# Patient Record
Sex: Female | Born: 1950 | Race: White | Hispanic: No | Marital: Married | Smoking: Former smoker
Health system: Southern US, Community
[De-identification: ages and names within clinical notes are randomized; demographics above are authoritative.]

## PROBLEM LIST (undated history)

## (undated) DIAGNOSIS — I10 Essential (primary) hypertension: Secondary | ICD-10-CM

## (undated) DIAGNOSIS — E079 Disorder of thyroid, unspecified: Secondary | ICD-10-CM

## (undated) DIAGNOSIS — N812 Incomplete uterovaginal prolapse: Secondary | ICD-10-CM

## (undated) DIAGNOSIS — E785 Hyperlipidemia, unspecified: Secondary | ICD-10-CM

## (undated) DIAGNOSIS — N8110 Cystocele, unspecified: Secondary | ICD-10-CM

## (undated) DIAGNOSIS — E782 Mixed hyperlipidemia: Secondary | ICD-10-CM

## (undated) DIAGNOSIS — N819 Female genital prolapse, unspecified: Secondary | ICD-10-CM

## (undated) DIAGNOSIS — Z973 Presence of spectacles and contact lenses: Secondary | ICD-10-CM

## (undated) DIAGNOSIS — N393 Stress incontinence (female) (male): Secondary | ICD-10-CM

## (undated) DIAGNOSIS — E039 Hypothyroidism, unspecified: Secondary | ICD-10-CM

## (undated) DIAGNOSIS — J45909 Unspecified asthma, uncomplicated: Secondary | ICD-10-CM

## (undated) HISTORY — DX: Disorder of thyroid, unspecified: E07.9

## (undated) HISTORY — PX: WISDOM TOOTH EXTRACTION: SHX21

## (undated) HISTORY — PX: TUBAL LIGATION: SHX77

## (undated) HISTORY — DX: Hyperlipidemia, unspecified: E78.5

---

## 1975-09-15 HISTORY — PX: TUBAL LIGATION: SHX77

## 1997-10-15 ENCOUNTER — Ambulatory Visit (HOSPITAL_COMMUNITY): Admission: RE | Admit: 1997-10-15 | Discharge: 1997-10-15 | Payer: Self-pay | Admitting: *Deleted

## 1998-07-18 ENCOUNTER — Other Ambulatory Visit: Admission: RE | Admit: 1998-07-18 | Discharge: 1998-07-18 | Payer: Self-pay | Admitting: *Deleted

## 1998-11-25 ENCOUNTER — Ambulatory Visit (HOSPITAL_COMMUNITY): Admission: RE | Admit: 1998-11-25 | Discharge: 1998-11-25 | Payer: Self-pay | Admitting: *Deleted

## 2000-08-02 ENCOUNTER — Encounter: Payer: Self-pay | Admitting: Family Medicine

## 2000-08-02 ENCOUNTER — Ambulatory Visit (HOSPITAL_COMMUNITY): Admission: RE | Admit: 2000-08-02 | Discharge: 2000-08-02 | Payer: Self-pay | Admitting: Family Medicine

## 2001-10-26 ENCOUNTER — Ambulatory Visit (HOSPITAL_COMMUNITY): Admission: RE | Admit: 2001-10-26 | Discharge: 2001-10-26 | Payer: Self-pay | Admitting: Family Medicine

## 2001-10-26 ENCOUNTER — Encounter: Payer: Self-pay | Admitting: Family Medicine

## 2001-11-16 ENCOUNTER — Other Ambulatory Visit: Admission: RE | Admit: 2001-11-16 | Discharge: 2001-11-16 | Payer: Self-pay | Admitting: Obstetrics and Gynecology

## 2003-12-12 ENCOUNTER — Ambulatory Visit (HOSPITAL_COMMUNITY): Admission: RE | Admit: 2003-12-12 | Discharge: 2003-12-12 | Payer: Self-pay | Admitting: Obstetrics and Gynecology

## 2004-01-09 ENCOUNTER — Other Ambulatory Visit: Admission: RE | Admit: 2004-01-09 | Discharge: 2004-01-09 | Payer: Self-pay | Admitting: Obstetrics and Gynecology

## 2004-12-29 ENCOUNTER — Ambulatory Visit (HOSPITAL_COMMUNITY): Admission: RE | Admit: 2004-12-29 | Discharge: 2004-12-29 | Payer: Self-pay | Admitting: Obstetrics and Gynecology

## 2005-04-13 ENCOUNTER — Other Ambulatory Visit: Admission: RE | Admit: 2005-04-13 | Discharge: 2005-04-13 | Payer: Self-pay | Admitting: Obstetrics and Gynecology

## 2006-10-12 ENCOUNTER — Ambulatory Visit (HOSPITAL_COMMUNITY): Admission: RE | Admit: 2006-10-12 | Discharge: 2006-10-12 | Payer: Self-pay | Admitting: Physical Therapy

## 2007-10-25 ENCOUNTER — Ambulatory Visit (HOSPITAL_COMMUNITY): Admission: RE | Admit: 2007-10-25 | Discharge: 2007-10-25 | Payer: Self-pay | Admitting: Obstetrics and Gynecology

## 2008-09-14 HISTORY — PX: WISDOM TOOTH EXTRACTION: SHX21

## 2009-10-23 ENCOUNTER — Ambulatory Visit (HOSPITAL_COMMUNITY): Admission: RE | Admit: 2009-10-23 | Discharge: 2009-10-23 | Payer: Self-pay | Admitting: Obstetrics and Gynecology

## 2010-10-01 ENCOUNTER — Encounter
Admission: RE | Admit: 2010-10-01 | Discharge: 2010-10-01 | Payer: Self-pay | Source: Home / Self Care | Attending: Obstetrics and Gynecology | Admitting: Obstetrics and Gynecology

## 2012-01-20 ENCOUNTER — Other Ambulatory Visit (HOSPITAL_COMMUNITY): Payer: Self-pay | Admitting: Obstetrics and Gynecology

## 2012-01-20 DIAGNOSIS — Z1231 Encounter for screening mammogram for malignant neoplasm of breast: Secondary | ICD-10-CM

## 2012-01-28 ENCOUNTER — Other Ambulatory Visit: Payer: Self-pay | Admitting: Obstetrics and Gynecology

## 2012-01-28 DIAGNOSIS — N644 Mastodynia: Secondary | ICD-10-CM

## 2012-02-05 ENCOUNTER — Other Ambulatory Visit: Payer: Self-pay | Admitting: Obstetrics and Gynecology

## 2012-02-05 ENCOUNTER — Ambulatory Visit
Admission: RE | Admit: 2012-02-05 | Discharge: 2012-02-05 | Disposition: A | Discharge status: Home / Self Care | Payer: 59 | Source: Ambulatory Visit | Attending: Obstetrics and Gynecology | Admitting: Obstetrics and Gynecology

## 2012-02-05 DIAGNOSIS — N644 Mastodynia: Secondary | ICD-10-CM

## 2012-02-16 ENCOUNTER — Ambulatory Visit (HOSPITAL_COMMUNITY): Payer: 59

## 2012-02-27 IMAGING — MG MM DIGITAL DIAGNOSTIC BILAT
4 series · 4 of 4 positions shown · non-contrast
Comparison: 10/23/2009, 10/25/2007, 10/12/2006

CLINICAL DATA: Several weeks ago, the patient had tenderness and
erythema of the skin in the left upper inner quadrant.  This
resolved several days later.

DIGITAL DIAGNOSTIC BILATERAL MAMMOGRAM WITH CAD

[R CC]
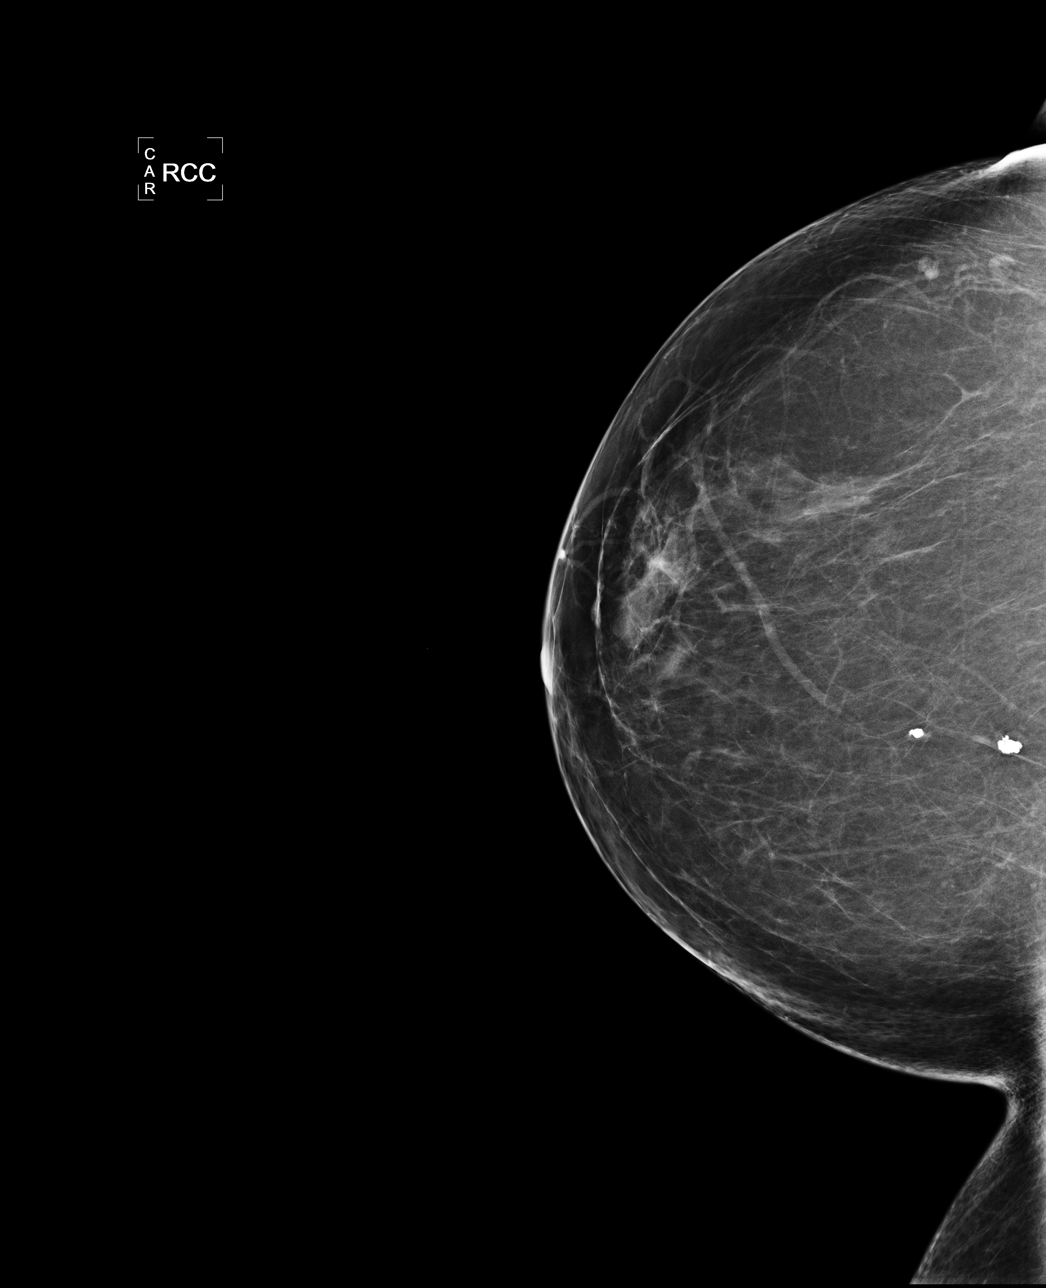

[L CC]
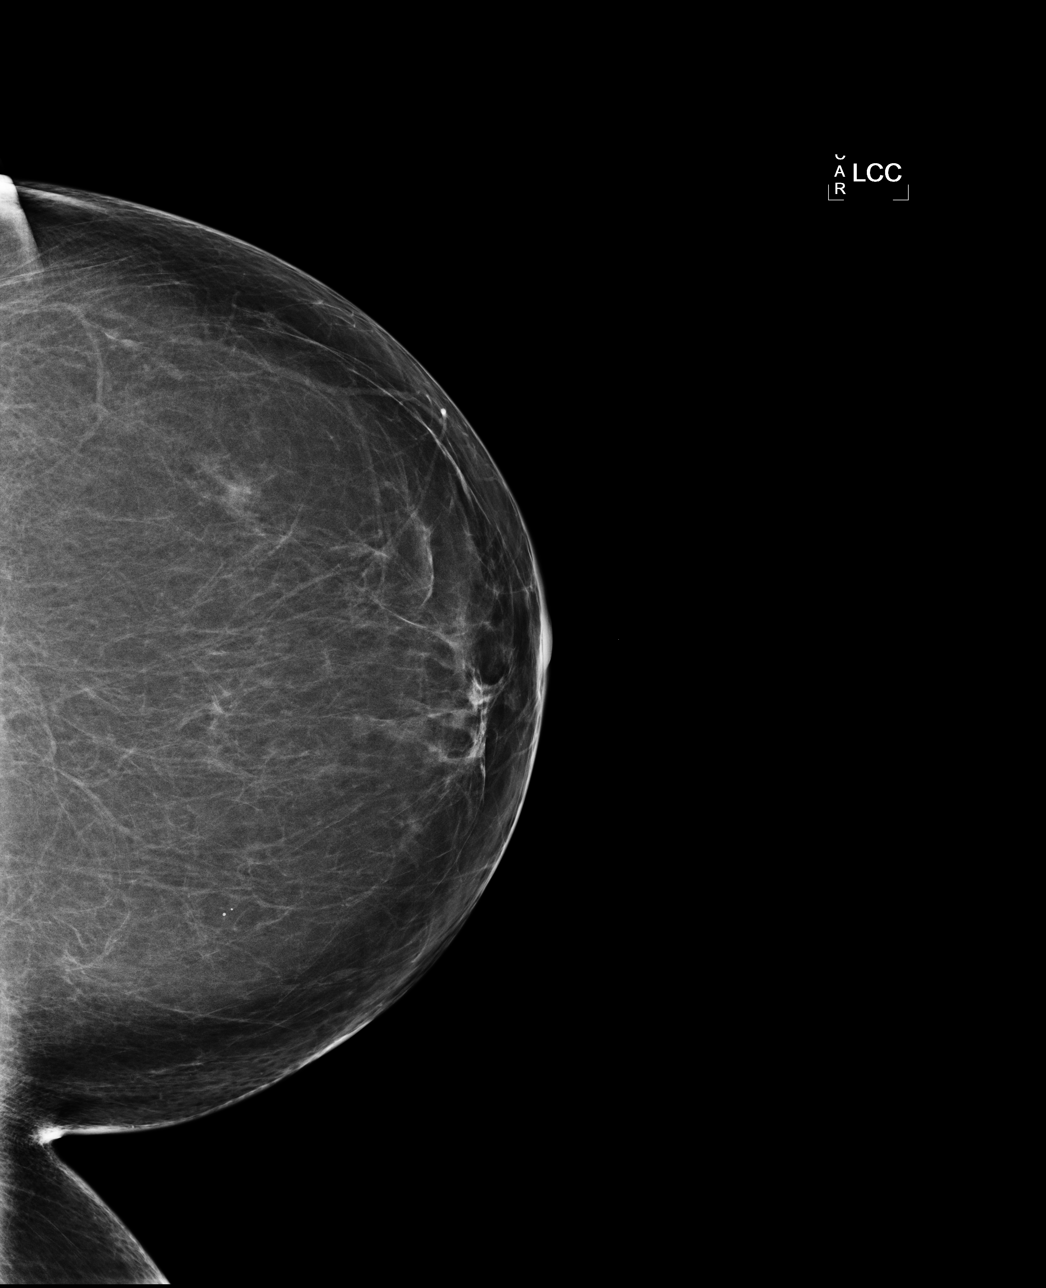

[L MLO]
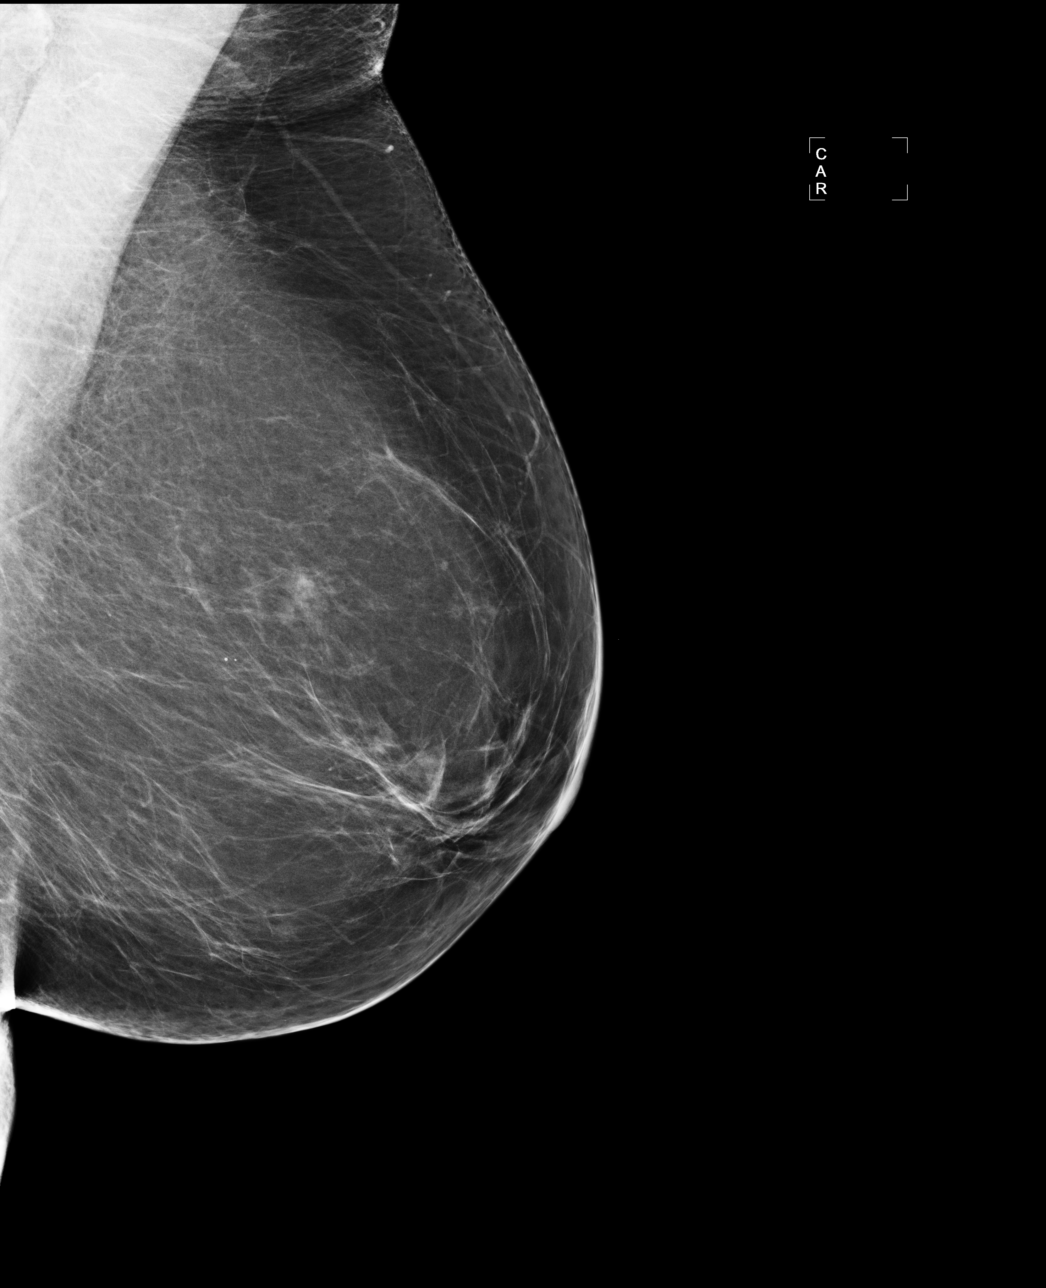

[R MLO]
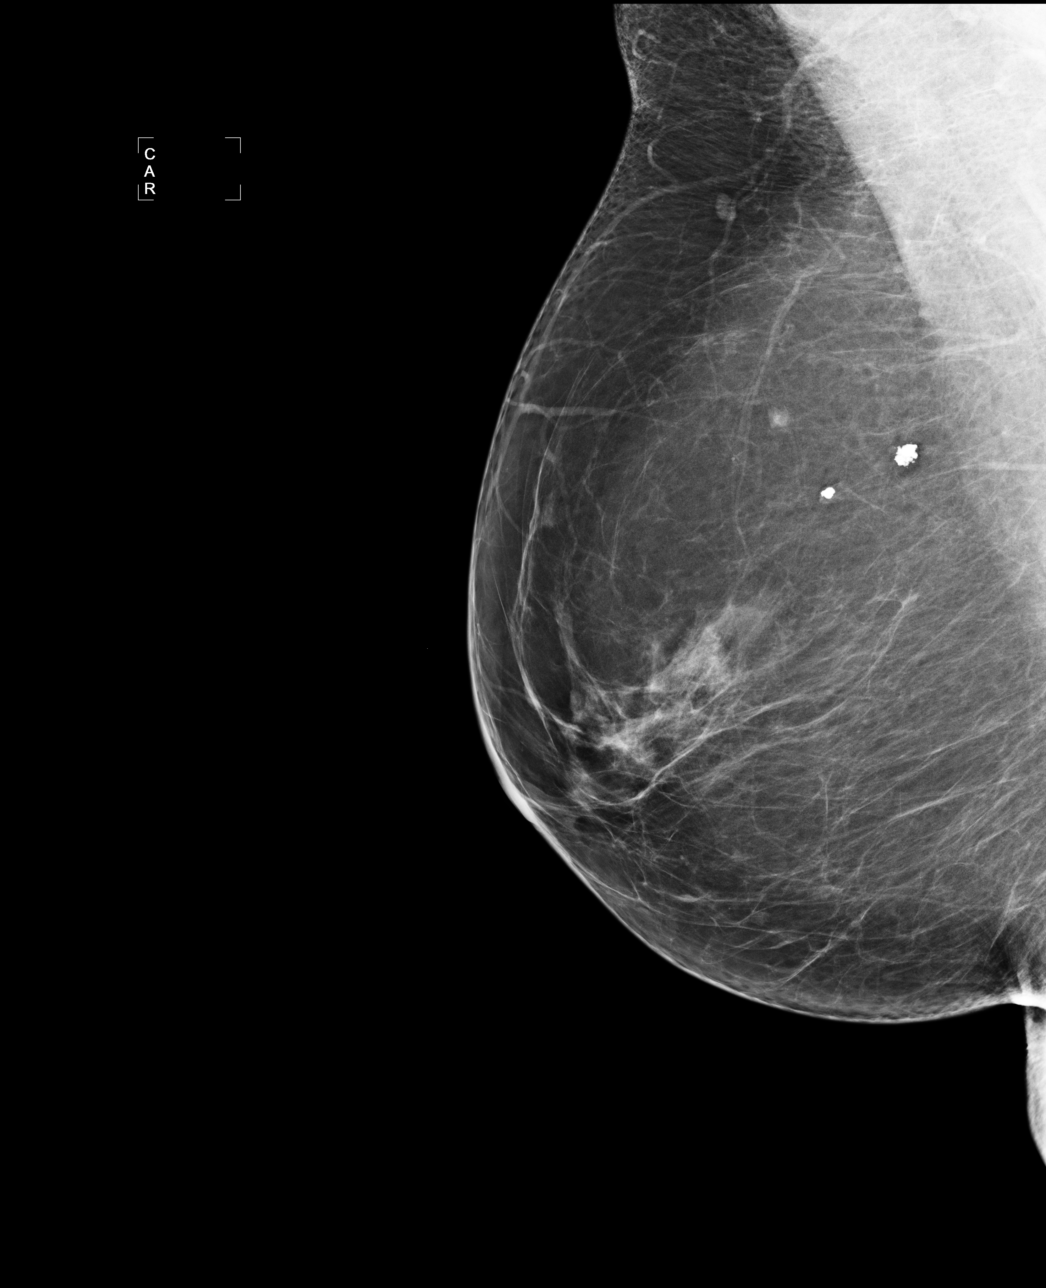

[4 of 4 positions shown; findings below may reference images not displayed]

FINDINGS: The breast tissue is almost entirely fatty.  There is no
dominant mass, architectural distortion or calcification to
malignancy.  On physical examination, no erythema is noted in the
left breast today.
Mammographic images were processed with CAD.
IMPRESSION: No mammographic evidence of malignancy.  Yearly screening
mammography is suggested.

BI-RADS CATEGORY 1:  Negative.

## 2012-08-09 ENCOUNTER — Ambulatory Visit (INDEPENDENT_AMBULATORY_CARE_PROVIDER_SITE_OTHER): Payer: 59 | Admitting: Cardiovascular Disease

## 2012-08-09 ENCOUNTER — Encounter: Payer: Self-pay | Admitting: Cardiovascular Disease

## 2012-08-09 VITALS — BP 128/80 | HR 73 | Ht 63.0 in | Wt 138.8 lb

## 2012-08-09 DIAGNOSIS — E785 Hyperlipidemia, unspecified: Secondary | ICD-10-CM

## 2012-08-09 DIAGNOSIS — R0609 Other forms of dyspnea: Secondary | ICD-10-CM | POA: Insufficient documentation

## 2012-08-09 DIAGNOSIS — R9431 Abnormal electrocardiogram [ECG] [EKG]: Secondary | ICD-10-CM

## 2012-08-09 NOTE — Progress Notes (Signed)
    Ebony Hatfield Date of Birth  1951-05-02       [facility] Office [address]  [address] [place], [place]  [address]   [place], [place]  [address] [phone]     [phone]   Fax  [phone]    Fax [phone]  Problem List: 1. Dyspnea with exertion 2. Hyperlipidemia 3. Hypothyroidism  History of Present Illness:  Ebony Hatfield is a 61 yo who presents for further evaluation of dyspnea with exertion.  She's walked the same route around her neighborhood for the past several years. She typically walks one to one and 1/2 miles a day. Recently, she's noticed severe shortness breath with any sort of exertion.  She is still able to complete the 1-1/2 mile route around her house but she has significant shortness of breath. The shortness of breath dyspnea. She denies any syncope. She denies chest pain.  Current Outpatient Prescriptions on File Prior to Visit  Medication Sig Dispense Refill  . colesevelam (WELCHOL) 625 MG tablet Take 1,875 mg by mouth 2 (two) times daily with a meal.      . fexofenadine (ALLEGRA) 180 MG tablet Take 180 mg by mouth daily.      . Pitavastatin Calcium (LIVALO PO) Take 4 mg by mouth daily.      Marland Kitchen SYNTHROID 100 MCG tablet Take 100 mcg by mouth daily.         No Known Allergies  Past Medical History  Diagnosis Date  . Thyroid disease   . Hyperlipidemia     Past Surgical History  Procedure Date  . Tubal ligation   . Wisdom tooth extraction     History  Smoking status  . Former Smoker  Smokeless tobacco  . Not on file    Comment: Quit 35 yrs ago    History  Alcohol Use  . Yes    Comment: 1 Glass Daily    History reviewed. No pertinent family history.  Reviw of Systems:  Reviewed in the HPI.  All other systems are negative.  Physical Exam: Blood pressure 128/80, pulse 73, height 5\' 3"  (1.6 m), weight 138 lb 12.8 oz (62.959 kg). General: Well developed, well nourished, in no acute  distress.  Head: Normocephalic, atraumatic, sclera non-icteric, mucus membranes are moist,   Neck: Supple. Carotids are 2 + without bruits. No JVD   Lungs: Clear   Heart: RR, normal S1, S2. No murmurs  Abdomen: Soft, non-tender, non-distended with normal bowel sounds.  Msk:  Strength and tone are normal   Extremities: No clubbing or cyanosis. No edema.  Distal pedal pulses are 2+ and equal    Neuro: CN II - XII intact.  Alert and oriented X 3.   Psych:  Normal   ECG: Nov. 26, 2013 - NSR at 7. Normal ECG.  Assessment / Plan:

## 2012-08-09 NOTE — Patient Instructions (Addendum)
Your physician has requested that you have an echocardiogram. Echocardiography is a painless test that uses sound waves to create images of your heart. It provides your doctor with information about the size and shape of your heart and how well your heart's chambers and valves are working. This procedure takes approximately one hour. There are no restrictions for this procedure.  Your physician has requested that you have en exercise stress myoview.  Please follow instruction sheet, as given.  Your physician recommends that you schedule a follow-up appointment in: 1 MONTH WITH DR Elease Hashimoto

## 2012-08-09 NOTE — Assessment & Plan Note (Signed)
Ebony Hatfield presents for further evaluation of her dyspnea on exertion. She has  walked in her neighborhood   about 1-1/2 miles a day for the past several years. Recently, she's had shortness breath when climbing hills. This is a fairly new to her.  She has some slight EKG abnormalities. She does not have any history of hypertension.  Out like to proceed with a stress Myoview study for further evaluation and to rule out ischemia. Will also need to get an echocardiogram to rule out diastolic dysfunction. I've asked her to continue walking. I'll see her again in one month for followup evaluation.

## 2012-08-09 NOTE — Assessment & Plan Note (Signed)
Continue with her same medications. This is being monitored by Dr. Collins Scotland.

## 2012-08-16 ENCOUNTER — Ambulatory Visit (HOSPITAL_COMMUNITY): Payer: 59 | Attending: Cardiovascular Disease

## 2012-08-16 DIAGNOSIS — E785 Hyperlipidemia, unspecified: Secondary | ICD-10-CM | POA: Insufficient documentation

## 2012-08-16 DIAGNOSIS — R0609 Other forms of dyspnea: Secondary | ICD-10-CM | POA: Insufficient documentation

## 2012-08-16 DIAGNOSIS — R9431 Abnormal electrocardiogram [ECG] [EKG]: Secondary | ICD-10-CM | POA: Insufficient documentation

## 2012-08-16 DIAGNOSIS — R0602 Shortness of breath: Secondary | ICD-10-CM

## 2012-08-16 DIAGNOSIS — R0989 Other specified symptoms and signs involving the circulatory and respiratory systems: Secondary | ICD-10-CM | POA: Insufficient documentation

## 2012-08-16 DIAGNOSIS — Z87891 Personal history of nicotine dependence: Secondary | ICD-10-CM | POA: Insufficient documentation

## 2012-08-16 NOTE — Progress Notes (Signed)
Echocardiogram performed.  

## 2012-08-17 ENCOUNTER — Telehealth: Payer: Self-pay | Admitting: Cardiovascular Disease

## 2012-08-17 NOTE — Telephone Encounter (Signed)
Error

## 2012-08-18 ENCOUNTER — Ambulatory Visit (HOSPITAL_COMMUNITY): Payer: 59 | Attending: Cardiology | Admitting: Radiology

## 2012-08-18 VITALS — BP 141/95 | HR 67 | Ht 63.0 in | Wt 134.0 lb

## 2012-08-18 DIAGNOSIS — R0602 Shortness of breath: Secondary | ICD-10-CM

## 2012-08-18 DIAGNOSIS — R9431 Abnormal electrocardiogram [ECG] [EKG]: Secondary | ICD-10-CM

## 2012-08-18 DIAGNOSIS — R0989 Other specified symptoms and signs involving the circulatory and respiratory systems: Secondary | ICD-10-CM | POA: Insufficient documentation

## 2012-08-18 DIAGNOSIS — R0609 Other forms of dyspnea: Secondary | ICD-10-CM | POA: Insufficient documentation

## 2012-08-18 DIAGNOSIS — Z87891 Personal history of nicotine dependence: Secondary | ICD-10-CM | POA: Insufficient documentation

## 2012-08-18 DIAGNOSIS — E785 Hyperlipidemia, unspecified: Secondary | ICD-10-CM | POA: Insufficient documentation

## 2012-08-18 MED ORDER — TECHNETIUM TC 99M SESTAMIBI GENERIC - CARDIOLITE
33.0000 | Freq: Once | INTRAVENOUS | Status: AC | PRN
  Administered 2012-08-18: 33 via INTRAVENOUS

## 2012-08-18 MED ORDER — TECHNETIUM TC 99M SESTAMIBI GENERIC - CARDIOLITE
11.0000 | Freq: Once | INTRAVENOUS | Status: AC | PRN
  Administered 2012-08-18: 11 via INTRAVENOUS

## 2012-08-18 NOTE — Progress Notes (Deleted)
   Patient ID: Ebony Hatfield, female    DOB: [birth year], 61 y.o.   MRN: [id number]  HPI    Review of Systems    Physical Exam

## 2012-08-18 NOTE — Progress Notes (Signed)
[facility] SITE 3 NUCLEAR MED [id number] [place] [address] [phone]  Cardiology Nuclear Med Study  Ebony Hatfield is a 61 y.o. female     MRN : [id number]     DOB: [birth year]  Procedure Date: 08/18/2012  Nuclear Med Background Indication for Stress Test:  Evaluation for Ischemia and Abnormal EKG History:  08/16/12 Echo:EF=65% Cardiac Risk Factors: History of Smoking and Lipids  Symptoms:  DOE   Nuclear Pre-Procedure Caffeine/Decaff Intake:  None > 12 hrs NPO After: 8:00pm   Lungs:  Clear. O2 Sat: 96% on room air. IV 0.9% NS with Angio Cath:  22g  IV Site: R Antecubital x 1, tolerated well IV Started by:  Irean Hong, RN  Chest Size (in):  36 Cup Size: D  Height: 5\' 3"  (1.6 m)  Weight:  134 lb (60.782 kg)  BMI:  Body mass index is 23.74 kg/(m^2). Tech Comments:  n/a    Nuclear Med Study 1 or 2 day study: 1 day  Stress Test Type:  Stress  Reading MD: Olga Millers, MD  Order Authorizing Provider:  Kristeen Miss, MD  Resting Radionuclide: Technetium 40m Sestamibi  Resting Radionuclide Dose: 11.0 mCi   Stress Radionuclide:  Technetium 67m Sestamibi  Stress Radionuclide Dose: 33.0 mCi           Stress Protocol Rest HR: 67 Stress HR: 144  Rest BP: 141/95 Stress BP: 215/105  Exercise Time (min): 6:46 METS: 7.0   Predicted Max HR: 157 bpm % Max HR: 91.72 bpm Rate Pressure Product: 40347   Dose of Adenosine (mg):  n/a Dose of Lexiscan: n/a mg  Dose of Atropine (mg): n/a Dose of Dobutamine: n/a mcg/kg/min (at max HR)  Stress Test Technologist: Smiley Houseman, CMA-N  Nuclear Technologist:  Domenic Polite, CNMT     Rest Procedure:  Myocardial perfusion imaging was performed at rest 45 minutes following the intravenous administration of Technetium 35m Sestamibi.  Rest ECG: NSR - Normal EKG  Stress Procedure:  The patient exercised on the treadmill utilizing the Bruce protocol for 6:46 minutes. She then stopped due to dyspnea and  denied any chest pain.  Technetium 44m Sestamibi was injected at peak exercise and myocardial perfusion imaging was performed after a brief delay.  Stress ECG: Insignificant upsloping ST segment depression.  QPS Raw Data Images:  Acquisition technically good; normal left ventricular size. Stress Images:  Normal homogeneous uptake in all areas of the myocardium. Rest Images:  Normal homogeneous uptake in all areas of the myocardium. Subtraction (SDS):  No evidence of ischemia. Transient Ischemic Dilatation (Normal <1.22):  0.94 Lung/Heart Ratio (Normal <0.45):  0.36  Quantitative Gated Spect Images QGS EDV:  39 ml QGS ESV:  6 ml  Impression Exercise Capacity:  Fair exercise capacity. BP Response:  Hypertensive blood pressure response. Clinical Symptoms:  There is dyspnea. ECG Impression:  Insignificant upsloping ST segment depression. Comparison with Prior Nuclear Study: No previous nuclear study performed  Overall Impression:  Normal stress nuclear study.  LV Ejection Fraction: 86%.  LV Wall Motion:  NL LV Function; NL Wall Motion  Olga Millers

## 2012-09-19 ENCOUNTER — Encounter: Payer: Self-pay | Admitting: Cardiovascular Disease

## 2012-09-19 ENCOUNTER — Ambulatory Visit (INDEPENDENT_AMBULATORY_CARE_PROVIDER_SITE_OTHER): Payer: 59 | Admitting: Cardiovascular Disease

## 2012-09-19 VITALS — BP 143/85 | HR 72 | Ht 63.0 in | Wt 137.0 lb

## 2012-09-19 DIAGNOSIS — R0602 Shortness of breath: Secondary | ICD-10-CM

## 2012-09-19 NOTE — Assessment & Plan Note (Signed)
The workup for her dyspnea was essentially normal. She is a normal echocardiogram. She has no ischemia on echo.  At this time we will see her on an as-needed basis. We will have her followup with her general medical Dr.

## 2012-09-19 NOTE — Patient Instructions (Addendum)
Your physician recommends that you schedule a follow-up appointment in: as needed basis, make appointment with your primary care physician for follow up.

## 2012-09-19 NOTE — Progress Notes (Signed)
Ebony Hatfield Date of Birth  1951/07/24       [facility] Office [address]  [address] [place], [place]  [address]   [place], [place]  [address] [phone]     [phone]   Fax  [phone]    Fax [phone]  Problem List: 1. Dyspnea with exertion 2. Hyperlipidemia 3. Hypothyroidism  History of Present Illness:  Ebony Hatfield is a 62 yo who presents for further evaluation of dyspnea with exertion.  She's walked the same route around her neighborhood for the past several years. She typically walks one to one and 1/2 miles a day. Recently, she's noticed severe shortness breath with any sort of exertion.  She is still able to complete the 1-1/2 mile route around her house but she has significant shortness of breath. The shortness of breath dyspnea. She denies any syncope. She denies chest pain.  09/19/2012: She had a stress Myoview study which revealed no evidence of ischemia. She had an echocardiogram which revealed a normal left ventricular systolic function. She does have some mild diastolic dysfunction.  She has not had any significant episodes of dyspnea.  She had improved with Singular and other breathing meds.  She continues to have leg cramps whenever she takes statins.   Current Outpatient Prescriptions on File Prior to Visit  Medication Sig Dispense Refill  . colesevelam (WELCHOL) 625 MG tablet Take 1,875 mg by mouth 2 (two) times daily with a meal.      . DULERA 200-5 MCG/ACT AERO 2 puffs 2 (two) times daily.       . fexofenadine (ALLEGRA) 180 MG tablet Take 180 mg by mouth as needed.       . montelukast (SINGULAIR) 10 MG tablet Take 10 mg by mouth at bedtime.       Marland Kitchen PROAIR HFA 108 (90 BASE) MCG/ACT inhaler Inhale into the lungs every 4 (four) hours as needed.       Marland Kitchen SYNTHROID 100 MCG tablet Take 100 mcg by mouth daily.         Allergies  Allergen Reactions  . Crestor (Rosuvastatin)     Leg pain   . Lipitor  (Atorvastatin)     Muscle aches  . Livalo (Pitavastatin)     LEG CRAMPS  . Pravachol (Pravastatin)     Muscle aches    Past Medical History  Diagnosis Date  . Thyroid disease   . Hyperlipidemia     Past Surgical History  Procedure Date  . Tubal ligation   . Wisdom tooth extraction     History  Smoking status  . Former Smoker  Smokeless tobacco  . Not on file    Comment: Quit 35 yrs ago    History  Alcohol Use  . Yes    Comment: 1 Glass Daily    No family history on file.  Reviw of Systems:  Reviewed in the HPI.  All other systems are negative.  Physical Exam: Blood pressure 143/85, pulse 72, height 5\' 3"  (1.6 m), weight 137 lb (62.143 kg). General: Well developed, well nourished, in no acute distress.  Head: Normocephalic, atraumatic, sclera non-icteric, mucus membranes are moist,   Neck: Supple. Carotids are 2 + without bruits. No JVD   Lungs: Clear   Heart: RR, normal S1, S2. No murmurs  Abdomen: Soft, non-tender, non-distended with normal bowel sounds.  Msk:  Strength and tone are normal   Extremities: No clubbing or cyanosis.  No edema.  Distal pedal pulses are 2+ and equal    Neuro: CN II - XII intact.  Alert and oriented X 3.   Psych:  Normal   ECG: Nov. 26, 2013 - NSR at 7. Normal ECG.  Assessment / Plan:

## 2013-06-01 ENCOUNTER — Other Ambulatory Visit: Payer: Self-pay

## 2013-06-01 DIAGNOSIS — Z1231 Encounter for screening mammogram for malignant neoplasm of breast: Secondary | ICD-10-CM

## 2013-06-22 ENCOUNTER — Ambulatory Visit
Admission: RE | Admit: 2013-06-22 | Discharge: 2013-06-22 | Disposition: A | Discharge status: Home / Self Care | Payer: Medicare Other | Source: Ambulatory Visit

## 2013-06-22 DIAGNOSIS — Z1231 Encounter for screening mammogram for malignant neoplasm of breast: Secondary | ICD-10-CM

## 2013-06-27 ENCOUNTER — Other Ambulatory Visit: Payer: Self-pay | Admitting: Obstetrics and Gynecology

## 2013-06-27 DIAGNOSIS — R928 Other abnormal and inconclusive findings on diagnostic imaging of breast: Secondary | ICD-10-CM

## 2013-07-12 ENCOUNTER — Ambulatory Visit
Admission: RE | Admit: 2013-07-12 | Discharge: 2013-07-12 | Disposition: A | Discharge status: Home / Self Care | Payer: Medicare Other | Source: Ambulatory Visit | Attending: Obstetrics and Gynecology | Admitting: Obstetrics and Gynecology

## 2013-07-12 DIAGNOSIS — R928 Other abnormal and inconclusive findings on diagnostic imaging of breast: Secondary | ICD-10-CM

## 2014-01-22 ENCOUNTER — Other Ambulatory Visit: Payer: Self-pay | Admitting: Obstetrics and Gynecology

## 2014-01-22 DIAGNOSIS — R921 Mammographic calcification found on diagnostic imaging of breast: Secondary | ICD-10-CM

## 2014-02-01 ENCOUNTER — Ambulatory Visit
Admission: RE | Admit: 2014-02-01 | Discharge: 2014-02-01 | Disposition: A | Discharge status: Home / Self Care | Payer: Medicare Other | Source: Ambulatory Visit | Attending: Obstetrics and Gynecology | Admitting: Obstetrics and Gynecology

## 2014-02-01 DIAGNOSIS — R921 Mammographic calcification found on diagnostic imaging of breast: Secondary | ICD-10-CM

## 2014-06-25 ENCOUNTER — Other Ambulatory Visit: Payer: Self-pay | Admitting: Obstetrics and Gynecology

## 2014-06-25 DIAGNOSIS — R921 Mammographic calcification found on diagnostic imaging of breast: Secondary | ICD-10-CM

## 2014-07-02 ENCOUNTER — Encounter (INDEPENDENT_AMBULATORY_CARE_PROVIDER_SITE_OTHER): Payer: Self-pay

## 2014-07-02 ENCOUNTER — Ambulatory Visit
Admission: RE | Admit: 2014-07-02 | Discharge: 2014-07-02 | Disposition: A | Discharge status: Home / Self Care | Payer: Medicare Other | Source: Ambulatory Visit | Attending: Obstetrics and Gynecology | Admitting: Obstetrics and Gynecology

## 2014-07-02 DIAGNOSIS — R921 Mammographic calcification found on diagnostic imaging of breast: Secondary | ICD-10-CM

## 2015-09-20 DIAGNOSIS — J22 Unspecified acute lower respiratory infection: Secondary | ICD-10-CM | POA: Diagnosis not present

## 2015-11-08 ENCOUNTER — Other Ambulatory Visit: Payer: Self-pay | Admitting: Obstetrics and Gynecology

## 2015-11-08 DIAGNOSIS — R921 Mammographic calcification found on diagnostic imaging of breast: Secondary | ICD-10-CM

## 2015-11-14 ENCOUNTER — Ambulatory Visit
Admission: RE | Admit: 2015-11-14 | Discharge: 2015-11-14 | Disposition: A | Discharge status: Home / Self Care | Payer: Medicare Other | Source: Ambulatory Visit | Attending: Obstetrics and Gynecology | Admitting: Obstetrics and Gynecology

## 2015-11-14 DIAGNOSIS — R921 Mammographic calcification found on diagnostic imaging of breast: Secondary | ICD-10-CM | POA: Diagnosis not present

## 2015-12-18 DIAGNOSIS — Z1211 Encounter for screening for malignant neoplasm of colon: Secondary | ICD-10-CM | POA: Diagnosis not present

## 2015-12-18 DIAGNOSIS — K5730 Diverticulosis of large intestine without perforation or abscess without bleeding: Secondary | ICD-10-CM | POA: Diagnosis not present

## 2016-02-19 DIAGNOSIS — E209 Hypoparathyroidism, unspecified: Secondary | ICD-10-CM | POA: Diagnosis not present

## 2016-02-19 DIAGNOSIS — Z6824 Body mass index (BMI) 24.0-24.9, adult: Secondary | ICD-10-CM | POA: Diagnosis not present

## 2016-02-19 DIAGNOSIS — Z1329 Encounter for screening for other suspected endocrine disorder: Secondary | ICD-10-CM | POA: Diagnosis not present

## 2016-02-19 DIAGNOSIS — Z1321 Encounter for screening for nutritional disorder: Secondary | ICD-10-CM | POA: Diagnosis not present

## 2016-02-19 DIAGNOSIS — Z124 Encounter for screening for malignant neoplasm of cervix: Secondary | ICD-10-CM | POA: Diagnosis not present

## 2016-02-19 DIAGNOSIS — Z01419 Encounter for gynecological examination (general) (routine) without abnormal findings: Secondary | ICD-10-CM | POA: Diagnosis not present

## 2016-03-11 DIAGNOSIS — M8588 Other specified disorders of bone density and structure, other site: Secondary | ICD-10-CM | POA: Diagnosis not present

## 2016-03-11 DIAGNOSIS — N958 Other specified menopausal and perimenopausal disorders: Secondary | ICD-10-CM | POA: Diagnosis not present

## 2016-04-29 DIAGNOSIS — H43813 Vitreous degeneration, bilateral: Secondary | ICD-10-CM | POA: Diagnosis not present

## 2016-04-29 DIAGNOSIS — H524 Presbyopia: Secondary | ICD-10-CM | POA: Diagnosis not present

## 2016-04-29 DIAGNOSIS — H5213 Myopia, bilateral: Secondary | ICD-10-CM | POA: Diagnosis not present

## 2016-04-29 DIAGNOSIS — H52223 Regular astigmatism, bilateral: Secondary | ICD-10-CM | POA: Diagnosis not present

## 2016-04-29 DIAGNOSIS — H2513 Age-related nuclear cataract, bilateral: Secondary | ICD-10-CM | POA: Diagnosis not present

## 2016-06-12 DIAGNOSIS — Z23 Encounter for immunization: Secondary | ICD-10-CM | POA: Diagnosis not present

## 2016-11-05 DIAGNOSIS — R198 Other specified symptoms and signs involving the digestive system and abdomen: Secondary | ICD-10-CM | POA: Diagnosis not present

## 2016-11-05 DIAGNOSIS — K5730 Diverticulosis of large intestine without perforation or abscess without bleeding: Secondary | ICD-10-CM | POA: Diagnosis not present

## 2017-01-15 ENCOUNTER — Other Ambulatory Visit: Payer: Self-pay | Admitting: Obstetrics and Gynecology

## 2017-01-15 DIAGNOSIS — Z1231 Encounter for screening mammogram for malignant neoplasm of breast: Secondary | ICD-10-CM

## 2017-02-09 DIAGNOSIS — J069 Acute upper respiratory infection, unspecified: Secondary | ICD-10-CM | POA: Diagnosis not present

## 2017-02-09 DIAGNOSIS — J4520 Mild intermittent asthma, uncomplicated: Secondary | ICD-10-CM | POA: Diagnosis not present

## 2017-02-11 ENCOUNTER — Ambulatory Visit
Admission: RE | Admit: 2017-02-11 | Discharge: 2017-02-11 | Disposition: A | Discharge status: Home / Self Care | Payer: Medicare Other | Source: Ambulatory Visit | Attending: Obstetrics and Gynecology | Admitting: Obstetrics and Gynecology

## 2017-02-11 DIAGNOSIS — Z1231 Encounter for screening mammogram for malignant neoplasm of breast: Secondary | ICD-10-CM

## 2017-02-23 DIAGNOSIS — E785 Hyperlipidemia, unspecified: Secondary | ICD-10-CM | POA: Diagnosis not present

## 2017-02-23 DIAGNOSIS — E039 Hypothyroidism, unspecified: Secondary | ICD-10-CM | POA: Diagnosis not present

## 2017-02-23 DIAGNOSIS — I10 Essential (primary) hypertension: Secondary | ICD-10-CM | POA: Diagnosis not present

## 2017-03-03 DIAGNOSIS — E785 Hyperlipidemia, unspecified: Secondary | ICD-10-CM | POA: Diagnosis not present

## 2017-03-03 DIAGNOSIS — E039 Hypothyroidism, unspecified: Secondary | ICD-10-CM | POA: Diagnosis not present

## 2017-04-21 DIAGNOSIS — Z23 Encounter for immunization: Secondary | ICD-10-CM | POA: Diagnosis not present

## 2017-04-29 DIAGNOSIS — H2513 Age-related nuclear cataract, bilateral: Secondary | ICD-10-CM | POA: Diagnosis not present

## 2017-06-17 DIAGNOSIS — Z23 Encounter for immunization: Secondary | ICD-10-CM | POA: Diagnosis not present

## 2017-08-03 ENCOUNTER — Telehealth: Payer: Self-pay

## 2017-08-03 NOTE — Telephone Encounter (Signed)
NOTES ENT TO SCHEDULING 

## 2017-08-27 ENCOUNTER — Encounter: Payer: Self-pay | Admitting: *Deleted

## 2017-09-02 ENCOUNTER — Ambulatory Visit (INDEPENDENT_AMBULATORY_CARE_PROVIDER_SITE_OTHER): Payer: Medicare Other | Admitting: Cardiovascular Disease

## 2017-09-02 ENCOUNTER — Encounter (INDEPENDENT_AMBULATORY_CARE_PROVIDER_SITE_OTHER): Payer: Self-pay

## 2017-09-02 ENCOUNTER — Encounter: Payer: Self-pay | Admitting: Cardiovascular Disease

## 2017-09-02 VITALS — BP 112/66 | HR 73 | Ht 62.0 in | Wt 132.8 lb

## 2017-09-02 DIAGNOSIS — E785 Hyperlipidemia, unspecified: Secondary | ICD-10-CM | POA: Diagnosis not present

## 2017-09-02 NOTE — Patient Instructions (Addendum)
Medication Instructions:  Your physician recommends that you continue on your current medications as directed. Please refer to the Current Medication list given to you today.  Labwork: NONE  Testing/Procedures: NONE  Follow-Up: Your physician recommends that you schedule an appointment with our lipid clinic.  Your physician wants you to follow-up in: 12 months with Dr. Elease HashimotoNahser. You will receive a reminder letter in the mail two months in advance. If you don't receive a letter, please call our office to schedule the follow-up appointment.   If you need a refill on your cardiac medications before your next appointment, please call your pharmacy.

## 2017-09-02 NOTE — Progress Notes (Signed)
Cardiology Office Note:    Date:  09/02/2017   ID:  Ebony Hatfield, DOB Apr 17, 1951, MRN [id number]  PCP:  Ebony HasMorrow, Aaron, MD  Cardiologist:  Ebony MissPhilip Nahser, MD    Referring MD: Ebony HasMorrow, Aaron, MD   Chief Complaint  Patient presents with  . Shortness of Breath    History of Present Illness:    Ebony Hatfield is a 66 y.o. female with a hx of have seen in the past for some shortness of breath.    She has a history of hyperlipidemia.  She is been intolerant to Pravachol, Lipitor,  Livalo and Crestor.  Ive seen [name] in 2014 for shortness of breath.  Myoview was normal  Echo shows normal LV systolic function, mild grade 1 diastolic dysfunction   Swims twice a week.  Walks her 2 dogs regularly   She is here to discuss lipid management.   Wants to consider PCSK-9 inhibitor    Past Medical History:  Diagnosis Date  . Hyperlipidemia   . Thyroid disease     Past Surgical History:  Procedure Laterality Date  . TUBAL LIGATION    . WISDOM TOOTH EXTRACTION      Current Medications: Current Meds  Medication Sig  . amLODipine (NORVASC) 5 MG tablet Take 5 mg by mouth daily.  . fexofenadine (ALLEGRA) 180 MG tablet Take 180 mg by mouth as needed.   Marland Kitchen. PROAIR HFA 108 (90 BASE) MCG/ACT inhaler Inhale into the lungs every 4 (four) hours as needed.   Marland Kitchen. SYNTHROID 100 MCG tablet Take 100 mcg by mouth daily.      Allergies:   Crestor [rosuvastatin]; Lipitor [atorvastatin]; Livalo [pitavastatin]; Pravachol [pravastatin]; and Statins   Social History   Socioeconomic History  . Marital status: Married    Spouse name: None  . Number of children: None  . Years of education: None  . Highest education level: None  Social Needs  . Financial resource strain: None  . Food insecurity - worry: None  . Food insecurity - inability: None  . Transportation needs - medical: None  . Transportation needs - non-medical: None  Occupational History  . None  Tobacco Use  . Smoking status: Former Games developermoker    . Smokeless tobacco: Never Used  . Tobacco comment: Quit 35 yrs ago  Substance and Sexual Activity  . Alcohol use: Yes    Comment: 1 Glass Daily  . Drug use: No  . Sexual activity: None  Other Topics Concern  . None  Social History Narrative  . None     Family History: The patient's family history includes Breast cancer (age of onset: 7060) in her maternal aunt and maternal grandmother. ROS:   Please see the history of present illness.     All other systems reviewed and are negative.  EKGs/Labs/Other Studies Reviewed:    The following studies were reviewed today:   EKG:  EKG is  ordered today.  September 02, 2017: Normal sinus rhythm at 73.  She has no ST or T wave changes.  Recent Labs: No results found for requested labs within last 8760 hours.  Recent Lipid Panel No results found for: CHOL, TRIG, HDL, CHOLHDL, VLDL, LDLCALC, LDLDIRECT  Physical Exam:    VS:  BP 112/66   Pulse 73   Ht 5\' 2"  (1.575 m)   Wt 132 lb 12.8 oz (60.2 kg)   SpO2 97%   BMI 24.29 kg/m     Wt Readings from Last 3 Encounters:  09/02/17 132 lb 12.8  oz (60.2 kg)  09/19/12 137 lb (62.1 kg)  08/18/12 134 lb (60.8 kg)     GEN:  Well nourished, well developed in no acute distress HEENT: Normal NECK: No JVD; No carotid bruits LYMPHATICS: No lymphadenopathy CARDIAC: RR, no murmurs, rubs, gallops RESPIRATORY:  Clear to auscultation without rales, wheezing or rhonchi  ABDOMEN: Soft, non-tender, non-distended MUSCULOSKELETAL:  No edema; No deformity  SKIN: Warm and dry NEUROLOGIC:  Alert and oriented x 3 PSYCHIATRIC:  Normal affect   ASSESSMENT:    No diagnosis found. PLAN:    In order of problems listed above:  1. Essential hypertension-she is on amlodipine 5 mg a day.  Her blood pressure is on the low end of normal.  She is not having any symptoms of syncope or presyncope.  Continue current medications.  2.  Hyperlipidemia: She is been intolerant to multiple statins.  We discussed  PCSk-9  inhibitors.  Will refer to Lipid clinic to discuss PCSK-9 inhibitor  I'll see her in 1 year   Medication Adjustments/Labs and Tests Ordered: Current medicines are reviewed at length with the patient today.  Concerns regarding medicines are outlined above.  No orders of the defined types were placed in this encounter.  No orders of the defined types were placed in this encounter.   Signed, Ebony MissPhilip Nahser, MD  09/02/2017 8:50 AM    [facility]

## 2017-09-21 NOTE — Progress Notes (Signed)
Patient ID: Ebony Hatfield                 DOB: [birth year]                    MRN: [id number]     HPI: Ebony Hatfield is a 67 y.o. female patient referred to lipid clinic by Dr Elease HashimotoNahser. PMH is significant for hyperlipidemia with statin intolerance and SOB. Her 10 year ASCVD risk is 9.5%.  Pt reports trying 4 statins previously. She would experience muscle pain and spasms in her thighs 2-4 weeks after starting therapy. When she would discontinue statins, her symptoms would resolve within 2 weeks. She states Crestor was the worst, however she was taking a high dose of this at 20mg  daily and she also went on a [place] trip and walked a lot during the same time frame that likely exacerbated her symptoms. She has also tried Terex CorporationWelchol which caused GI problems and red yeast rice which was ineffective.  She does not have any family history of CAD and is negative for familial hypercholesterolemia (Dutch lipid score of only 3 for elevated LDL).   Current Medications: none Intolerances: pravastatin, atorvastatin, rosuvastatin 20mg  daily, Livalo 4mg  daily - all caused myalgias. Welchol 1,875mg  BID - GI issues. Red yeast rice - ineffective Risk Factors: LDL > 190 LDL goal: 130mg /dL  Diet: 1 egg a day. Does have 1-2 glasses of wine each day. Likes chicken and fish.  Exercise: Walks her 2 dogs regularly - 1 mile each day. Swims twice a week.  Family History: Breast cancer in her maternal aunt and maternal grandmother.  Social History: Former smoker, quit 35 years ago. Drinks 1 glass of alcohol each day.  Labs: 02/2017: TC 292, HDL 48, TG 180, LDL 208 (no therapy)  Past Medical History:  Diagnosis Date  . Hyperlipidemia   . Thyroid disease     Current Outpatient Medications on File Prior to Visit  Medication Sig Dispense Refill  . amLODipine (NORVASC) 5 MG tablet Take 5 mg by mouth daily.  5  . fexofenadine (ALLEGRA) 180 MG tablet Take 180 mg by mouth as needed.     Marland Kitchen. PROAIR HFA 108 (90 BASE) MCG/ACT  inhaler Inhale into the lungs every 4 (four) hours as needed.     Marland Kitchen. SYNTHROID 100 MCG tablet Take 100 mcg by mouth daily.      No current facility-administered medications on file prior to visit.     Allergies  Allergen Reactions  . Crestor [Rosuvastatin]     Leg pain   . Lipitor [Atorvastatin]     Muscle aches  . Livalo [Pitavastatin]     LEG CRAMPS  . Pravachol [Pravastatin]     Muscle aches  . Statins     Other reaction(s): Myalgias (intolerance)    Assessment/Plan:  1. Hyperlipidemia - Baseline LDL > 200 is above goal <130 for primary prevention with no other risk factors. Pt does not have FH ([place] lipid criteria score of 3) or ASCVD, so she will not qualify for PCSK9i therapy. Her 10 year ASCVD risk is 9.5% so ideally would prefer pt to be taking moderate intensity statin therapy. Discussed statin rechallenge and Zetia as options and pt is willing to try another statin. Will start low dose rosuvastatin 5mg  daily (pt previously intolerant to much higher dose of 20mg  daily) with instructions to decrease frequency to 3-4x per week if she develops myalgias on daily dosing. Will call pt in 1 month to assess  tolerability and will schedule f/u labs at that time if pt is tolerating well. Can increase dose or add Zetia if needed in the future.   Megan E. Supple, PharmD, CPP, BCACP [facility] [address] [place], [place] [address] Phone: [phone]; Fax: [phone] 09/22/2017 9:52 AM

## 2017-09-22 ENCOUNTER — Ambulatory Visit (INDEPENDENT_AMBULATORY_CARE_PROVIDER_SITE_OTHER): Payer: Medicare Other | Admitting: Pharmacist

## 2017-09-22 DIAGNOSIS — E782 Mixed hyperlipidemia: Secondary | ICD-10-CM

## 2017-09-22 MED ORDER — ROSUVASTATIN CALCIUM 5 MG PO TABS: ORAL_TABLET | ORAL | 11 refills | Status: DC

## 2017-09-22 NOTE — Patient Instructions (Signed)
It was nice to meet you today  Please start taking rosuvastatin (Crestor) 5mg  daily. If you start to notice any cramps or aches, take a few days off of the medicine then resume taking it every other day or 3 days per week.  Call Megan, Pharmacist in the lipid clinic with any concerns [phone]  I will reach out to you in a month to see how you are feeling on the Crestor

## 2017-10-26 ENCOUNTER — Telehealth: Payer: Self-pay | Admitting: Pharmacist

## 2017-10-26 DIAGNOSIS — E782 Mixed hyperlipidemia: Secondary | ICD-10-CM

## 2017-10-26 NOTE — Telephone Encounter (Signed)
Called patient to see how she is tolerating her low dose Crestor. Overall, she has been tolerating her Crestor 5mg  well. She will take the Crestor 5mg  during the week and take a drug holiday on the weekend which she is tolerating better than daily dosing. She prefers to continue this dosing rather than increasing dose or frequency. Scheduled lab work in 6-8 weeks and can assess need for Crestor dose titration at that time.

## 2017-11-09 DIAGNOSIS — J45901 Unspecified asthma with (acute) exacerbation: Secondary | ICD-10-CM | POA: Diagnosis not present

## 2017-11-12 DIAGNOSIS — I10 Essential (primary) hypertension: Secondary | ICD-10-CM | POA: Diagnosis not present

## 2017-11-12 DIAGNOSIS — R42 Dizziness and giddiness: Secondary | ICD-10-CM | POA: Diagnosis not present

## 2017-11-12 DIAGNOSIS — E039 Hypothyroidism, unspecified: Secondary | ICD-10-CM | POA: Diagnosis not present

## 2017-11-12 DIAGNOSIS — E785 Hyperlipidemia, unspecified: Secondary | ICD-10-CM | POA: Diagnosis not present

## 2017-12-08 ENCOUNTER — Other Ambulatory Visit: Payer: Medicare Other | Admitting: *Deleted

## 2017-12-08 ENCOUNTER — Other Ambulatory Visit: Payer: Self-pay | Admitting: Cardiovascular Disease

## 2017-12-08 DIAGNOSIS — E782 Mixed hyperlipidemia: Secondary | ICD-10-CM

## 2017-12-08 LAB — HEPATIC FUNCTION PANEL
ALT: 11 IU/L (ref 0–32)
AST: 18 IU/L (ref 0–40)
Albumin: 4.2 g/dL (ref 3.6–4.8)
Alkaline Phosphatase: 63 IU/L (ref 39–117)
BILIRUBIN, DIRECT: 0.08 mg/dL (ref 0.00–0.40)
Bilirubin Total: 0.4 mg/dL (ref 0.0–1.2)
TOTAL PROTEIN: 6.5 g/dL (ref 6.0–8.5)

## 2017-12-08 LAB — LIPID PANEL
Chol/HDL Ratio: 3.8 ratio (ref 0.0–4.4)
Cholesterol, Total: 196 mg/dL (ref 100–199)
HDL: 52 mg/dL (ref >39)
LDL Calculated: 115 mg/dL — ABNORMAL HIGH (ref 0–99)
Triglycerides: 145 mg/dL (ref 0–149)
VLDL Cholesterol Cal: 29 mg/dL (ref 5–40)

## 2017-12-27 DIAGNOSIS — E039 Hypothyroidism, unspecified: Secondary | ICD-10-CM | POA: Diagnosis not present

## 2018-04-08 DIAGNOSIS — L309 Dermatitis, unspecified: Secondary | ICD-10-CM | POA: Diagnosis not present

## 2018-06-24 DIAGNOSIS — Z23 Encounter for immunization: Secondary | ICD-10-CM | POA: Diagnosis not present

## 2018-07-13 ENCOUNTER — Other Ambulatory Visit: Payer: Self-pay | Admitting: Obstetrics and Gynecology

## 2018-07-13 DIAGNOSIS — Z1231 Encounter for screening mammogram for malignant neoplasm of breast: Secondary | ICD-10-CM

## 2018-08-24 ENCOUNTER — Ambulatory Visit: Payer: Medicare Other

## 2018-08-26 ENCOUNTER — Encounter: Payer: Self-pay | Admitting: Cardiovascular Disease

## 2018-09-02 ENCOUNTER — Ambulatory Visit: Payer: Medicare Other | Admitting: Cardiovascular Disease

## 2018-09-21 ENCOUNTER — Encounter: Payer: Self-pay | Admitting: Cardiovascular Disease

## 2018-09-21 ENCOUNTER — Ambulatory Visit (INDEPENDENT_AMBULATORY_CARE_PROVIDER_SITE_OTHER): Payer: Medicare Other | Admitting: Cardiovascular Disease

## 2018-09-21 VITALS — BP 126/72 | HR 75 | Ht 62.0 in | Wt 134.1 lb

## 2018-09-21 DIAGNOSIS — R0609 Other forms of dyspnea: Secondary | ICD-10-CM | POA: Diagnosis not present

## 2018-09-21 DIAGNOSIS — E782 Mixed hyperlipidemia: Secondary | ICD-10-CM | POA: Diagnosis not present

## 2018-09-21 MED ORDER — ROSUVASTATIN CALCIUM 5 MG PO TABS: ORAL_TABLET | ORAL | 3 refills | Status: DC

## 2018-09-21 NOTE — Patient Instructions (Signed)
Your physician recommends that you continue on your current medications as directed. Please refer to the Current Medication list given to you today.    Your physician recommends that you schedule a follow-up appointment in:  AS NEEDED 

## 2018-09-21 NOTE — Progress Notes (Signed)
Cardiology Office Note:    Date:  09/21/2018   ID:  Ebony Hatfield, DOB [birth year], MRN [id number]  PCP:  Farris Has, MD  Cardiologist:  Kristeen Miss, MD    Referring MD: Farris Has, MD   Chief Complaint  Patient presents with  . Hyperlipidemia       Ebony Hatfield is a 68 y.o. female with a hx of have seen in the past for some shortness of breath.    She has a history of hyperlipidemia.  She is been intolerant to Pravachol, Lipitor,  Livalo and Crestor.  Ive seen Ebony Hatfield in 2014 for shortness of breath.  Myoview was normal  Echo shows normal LV systolic function, mild grade 1 diastolic dysfunction   Swims twice a week.  Walks her 2 dogs regularly   She is here to discuss lipid management.   Wants to consider PCSK-9 inhibitor   January, 2020:  Ebony Hatfield is seen for follow up of her hyperlipidemia. Is tolerating the Rosuvastatin 5 mg a day  She is not yet committed to a low-fat low-cholesterol diet. Exercises on a regular basis.  Past Medical History:  Diagnosis Date  . Hyperlipidemia   . Thyroid disease     Past Surgical History:  Procedure Laterality Date  . TUBAL LIGATION    . WISDOM TOOTH EXTRACTION      Current Medications: Current Meds  Medication Sig  . amLODipine (NORVASC) 5 MG tablet Take 5 mg by mouth daily.  . fexofenadine (ALLEGRA) 180 MG tablet Take 180 mg by mouth as needed.   Marland Kitchen levothyroxine (SYNTHROID, LEVOTHROID) 88 MCG tablet Take 88 mcg by mouth daily before breakfast.  . PROAIR HFA 108 (90 BASE) MCG/ACT inhaler Inhale into the lungs every 4 (four) hours as needed.   . rosuvastatin (CRESTOR) 5 MG tablet Take 1 tablet by mouth daily or as tolerated.  . [DISCONTINUED] rosuvastatin (CRESTOR) 5 MG tablet Take 1 tablet by mouth daily or as tolerated.     Allergies:   Lipitor [atorvastatin]; Livalo [pitavastatin]; Pravachol [pravastatin]; and Statins   Social History   Socioeconomic History  . Marital status: Married    Spouse name: Not on file    . Number of children: Not on file  . Years of education: Not on file  . Highest education level: Not on file  Occupational History  . Not on file  Social Needs  . Financial resource strain: Not on file  . Food insecurity:    Worry: Not on file    Inability: Not on file  . Transportation needs:    Medical: Not on file    Non-medical: Not on file  Tobacco Use  . Smoking status: Former Games developer  . Smokeless tobacco: Never Used  . Tobacco comment: Quit 35 yrs ago  Substance and Sexual Activity  . Alcohol use: Yes    Comment: 1 Glass Daily  . Drug use: No  . Sexual activity: Not on file  Lifestyle  . Physical activity:    Days per week: Not on file    Minutes per session: Not on file  . Stress: Not on file  Relationships  . Social connections:    Talks on phone: Not on file    Gets together: Not on file    Attends religious service: Not on file    Active member of club or organization: Not on file    Attends meetings of clubs or organizations: Not on file    Relationship status: Not on file  Other Topics Concern  . Not on file  Social History Narrative  . Not on file     Family History: The patient's family history includes Breast cancer (age of onset: 56) in her maternal aunt and maternal grandmother. ROS:   Please see the history of present illness.     All other systems reviewed and are negative.  EKGs/Labs/Other Studies Reviewed:    The following studies were reviewed today:   EKG:  EKG is  ordered today.  September 02, 2017: Normal sinus rhythm at 73.  She has no ST or T wave changes.  Recent Labs: 12/08/2017: ALT 11  Recent Lipid Panel    Component Value Date/Time   CHOL 196 12/08/2017 0746   TRIG 145 12/08/2017 0746   HDL 52 12/08/2017 0746   CHOLHDL 3.8 12/08/2017 0746   LDLCALC 115 (H) 12/08/2017 0746   Physical Exam: Blood pressure 126/72, pulse 75, height 5\' 2"  (1.575 m), weight 134 lb 1.9 oz (60.8 kg), SpO2 95 %.  GEN:  Well nourished, well  developed in no acute distress HEENT: Normal NECK: No JVD; No carotid bruits LYMPHATICS: No lymphadenopathy CARDIAC: RRR, no murmurs, rubs, gallops RESPIRATORY:  Clear to auscultation without rales, wheezing or rhonchi  ABDOMEN: Soft, non-tender, non-distended MUSCULOSKELETAL:  No edema; No deformity  SKIN: Warm and dry NEUROLOGIC:  Alert and oriented x 3    ASSESSMENT:    1. Dyspnea on exertion   2. Mixed hyperlipidemia    PLAN:    In order of problems listed above:  1. Essential hypertension : Blood pressures well controlled.  Continue current medications  2.  Hyperlipidemia: She is tolerating rosuvastatin 5 mg a day.  Continue with this dose of the medication. The pharmacy department does not think that her insurance will cover the PCSK9 inhibitor.  She will continue with rosuvastatin.  She will follow-up with her primary medical doctor.   We will see her on an as-needed basis.  Medication Adjustments/Labs and Tests Ordered: Current medicines are reviewed at length with the patient today.  Concerns regarding medicines are outlined above.  Orders Placed This Encounter  Procedures  . EKG 12-Lead   Meds ordered this encounter  Medications  . rosuvastatin (CRESTOR) 5 MG tablet    Sig: Take 1 tablet by mouth daily or as tolerated.    Dispense:  90 tablet    Refill:  3    Signed, Kristeen Miss, MD  09/21/2018 5:25 PM    [facility]

## 2018-10-10 DIAGNOSIS — Z01419 Encounter for gynecological examination (general) (routine) without abnormal findings: Secondary | ICD-10-CM | POA: Diagnosis not present

## 2018-10-10 DIAGNOSIS — Z0000 Encounter for general adult medical examination without abnormal findings: Secondary | ICD-10-CM | POA: Diagnosis not present

## 2018-10-10 DIAGNOSIS — Z6824 Body mass index (BMI) 24.0-24.9, adult: Secondary | ICD-10-CM | POA: Diagnosis not present

## 2018-10-10 DIAGNOSIS — Z124 Encounter for screening for malignant neoplasm of cervix: Secondary | ICD-10-CM | POA: Diagnosis not present

## 2018-10-19 ENCOUNTER — Ambulatory Visit
Admission: RE | Admit: 2018-10-19 | Discharge: 2018-10-19 | Disposition: A | Discharge status: Home / Self Care | Payer: Medicare Other | Source: Ambulatory Visit | Attending: Obstetrics and Gynecology | Admitting: Obstetrics and Gynecology

## 2018-10-19 DIAGNOSIS — Z1231 Encounter for screening mammogram for malignant neoplasm of breast: Secondary | ICD-10-CM | POA: Diagnosis not present

## 2018-11-23 DIAGNOSIS — I10 Essential (primary) hypertension: Secondary | ICD-10-CM | POA: Diagnosis not present

## 2018-11-23 DIAGNOSIS — Z0000 Encounter for general adult medical examination without abnormal findings: Secondary | ICD-10-CM | POA: Diagnosis not present

## 2018-11-23 DIAGNOSIS — E785 Hyperlipidemia, unspecified: Secondary | ICD-10-CM | POA: Diagnosis not present

## 2018-11-23 DIAGNOSIS — Z23 Encounter for immunization: Secondary | ICD-10-CM | POA: Diagnosis not present

## 2018-11-23 DIAGNOSIS — E039 Hypothyroidism, unspecified: Secondary | ICD-10-CM | POA: Diagnosis not present

## 2019-03-16 DIAGNOSIS — H524 Presbyopia: Secondary | ICD-10-CM | POA: Diagnosis not present

## 2019-03-16 DIAGNOSIS — H2513 Age-related nuclear cataract, bilateral: Secondary | ICD-10-CM | POA: Diagnosis not present

## 2019-03-16 DIAGNOSIS — H43813 Vitreous degeneration, bilateral: Secondary | ICD-10-CM | POA: Diagnosis not present

## 2019-03-16 DIAGNOSIS — H52223 Regular astigmatism, bilateral: Secondary | ICD-10-CM | POA: Diagnosis not present

## 2019-03-16 DIAGNOSIS — H5213 Myopia, bilateral: Secondary | ICD-10-CM | POA: Diagnosis not present

## 2019-04-28 DIAGNOSIS — Z23 Encounter for immunization: Secondary | ICD-10-CM | POA: Diagnosis not present

## 2019-07-17 ENCOUNTER — Other Ambulatory Visit: Payer: Self-pay

## 2019-07-17 DIAGNOSIS — Z20828 Contact with and (suspected) exposure to other viral communicable diseases: Secondary | ICD-10-CM | POA: Diagnosis not present

## 2019-07-17 DIAGNOSIS — Z20822 Contact with and (suspected) exposure to covid-19: Secondary | ICD-10-CM

## 2019-07-18 LAB — NOVEL CORONAVIRUS, NAA: SARS-CoV-2, NAA: NOT DETECTED

## 2019-08-15 DIAGNOSIS — Z20828 Contact with and (suspected) exposure to other viral communicable diseases: Secondary | ICD-10-CM | POA: Diagnosis not present

## 2019-08-17 DIAGNOSIS — J988 Other specified respiratory disorders: Secondary | ICD-10-CM | POA: Diagnosis not present

## 2019-08-18 DIAGNOSIS — R05 Cough: Secondary | ICD-10-CM | POA: Diagnosis not present

## 2019-10-03 ENCOUNTER — Ambulatory Visit: Payer: Medicare Other | Attending: Internal Medicine

## 2019-10-03 ENCOUNTER — Other Ambulatory Visit: Payer: Self-pay | Admitting: Cardiovascular Disease

## 2019-10-03 DIAGNOSIS — Z23 Encounter for immunization: Secondary | ICD-10-CM | POA: Diagnosis not present

## 2019-10-03 NOTE — Progress Notes (Signed)
   Covid-19 Vaccination Clinic  Name:  Ebony Hatfield    MRN: [id number] DOB: 28-May-1951  10/03/2019  [name] was observed post Covid-19 immunization for 15 minutes without incidence. She was provided with Vaccine Information Sheet and instruction to access the V-Safe system.   [name] was instructed to call 911 with any severe reactions post vaccine: Marland Kitchen Difficulty breathing  . Swelling of your face and throat  . A fast heartbeat  . A bad rash all over your body  . Dizziness and weakness    Immunizations Administered    Name Date Dose VIS Date Route   Pfizer COVID-19 Vaccine 10/03/2019  4:17 PM 0.3 mL 08/25/2019 Intramuscular   Manufacturer: [employer], [employer]   Lot: [id number]   NDC: [address]-0

## 2019-10-21 ENCOUNTER — Ambulatory Visit: Payer: Medicare Other | Attending: Internal Medicine

## 2019-10-21 DIAGNOSIS — Z23 Encounter for immunization: Secondary | ICD-10-CM

## 2019-10-21 NOTE — Progress Notes (Signed)
   Covid-19 Vaccination Clinic  Name:  Ebony Hatfield    MRN: [id number] DOB: June 01, 1951  10/21/2019  [name] was observed post Covid-19 immunization for 15 minutes without incidence. She was provided with Vaccine Information Sheet and instruction to access the V-Safe system.   [name] was instructed to call 911 with any severe reactions post vaccine: Marland Kitchen Difficulty breathing  . Swelling of your face and throat  . A fast heartbeat  . A bad rash all over your body  . Dizziness and weakness    Immunizations Administered    Name Date Dose VIS Date Route   Pfizer COVID-19 Vaccine 10/21/2019 11:50 AM 0.3 mL 08/25/2019 Intramuscular   Manufacturer: [employer], [employer]   Lot: [id number]   NDC: [address]-9

## 2019-10-29 ENCOUNTER — Other Ambulatory Visit: Payer: Self-pay | Admitting: Cardiovascular Disease

## 2019-11-26 ENCOUNTER — Other Ambulatory Visit: Payer: Self-pay | Admitting: Cardiovascular Disease

## 2019-12-19 DIAGNOSIS — Z0000 Encounter for general adult medical examination without abnormal findings: Secondary | ICD-10-CM | POA: Diagnosis not present

## 2019-12-19 DIAGNOSIS — E785 Hyperlipidemia, unspecified: Secondary | ICD-10-CM | POA: Diagnosis not present

## 2019-12-19 DIAGNOSIS — E039 Hypothyroidism, unspecified: Secondary | ICD-10-CM | POA: Diagnosis not present

## 2019-12-21 DIAGNOSIS — E2839 Other primary ovarian failure: Secondary | ICD-10-CM | POA: Diagnosis not present

## 2019-12-21 DIAGNOSIS — Z23 Encounter for immunization: Secondary | ICD-10-CM | POA: Diagnosis not present

## 2019-12-21 DIAGNOSIS — E785 Hyperlipidemia, unspecified: Secondary | ICD-10-CM | POA: Diagnosis not present

## 2019-12-21 DIAGNOSIS — E039 Hypothyroidism, unspecified: Secondary | ICD-10-CM | POA: Diagnosis not present

## 2019-12-21 DIAGNOSIS — I10 Essential (primary) hypertension: Secondary | ICD-10-CM | POA: Diagnosis not present

## 2019-12-21 DIAGNOSIS — Z0000 Encounter for general adult medical examination without abnormal findings: Secondary | ICD-10-CM | POA: Diagnosis not present

## 2019-12-28 ENCOUNTER — Other Ambulatory Visit: Payer: Self-pay | Admitting: Family Medicine

## 2019-12-28 DIAGNOSIS — E2839 Other primary ovarian failure: Secondary | ICD-10-CM

## 2020-09-14 HISTORY — PX: COLONOSCOPY: SHX174

## 2020-09-14 HISTORY — PX: CATARACT EXTRACTION W/ INTRAOCULAR LENS IMPLANT: SHX1309

## 2021-02-14 ENCOUNTER — Other Ambulatory Visit: Payer: Self-pay | Admitting: Obstetrics and Gynecology

## 2021-02-14 DIAGNOSIS — Z1231 Encounter for screening mammogram for malignant neoplasm of breast: Secondary | ICD-10-CM

## 2021-02-15 ENCOUNTER — Ambulatory Visit
Admission: RE | Admit: 2021-02-15 | Discharge: 2021-02-15 | Disposition: A | Discharge status: Home / Self Care | Payer: 59 | Source: Ambulatory Visit | Attending: Obstetrics and Gynecology | Admitting: Obstetrics and Gynecology

## 2021-02-15 ENCOUNTER — Other Ambulatory Visit: Payer: Self-pay

## 2021-02-15 DIAGNOSIS — Z1231 Encounter for screening mammogram for malignant neoplasm of breast: Secondary | ICD-10-CM

## 2021-12-31 ENCOUNTER — Ambulatory Visit: Payer: Medicare Other | Admitting: Podiatry

## 2021-12-31 ENCOUNTER — Other Ambulatory Visit: Payer: Self-pay | Admitting: Podiatry

## 2021-12-31 ENCOUNTER — Ambulatory Visit (INDEPENDENT_AMBULATORY_CARE_PROVIDER_SITE_OTHER): Payer: 59

## 2021-12-31 DIAGNOSIS — M79671 Pain in right foot: Secondary | ICD-10-CM

## 2021-12-31 DIAGNOSIS — M722 Plantar fascial fibromatosis: Secondary | ICD-10-CM

## 2022-01-01 ENCOUNTER — Encounter: Payer: Self-pay | Admitting: Podiatry

## 2022-01-01 NOTE — Progress Notes (Signed)
?Subjective:  ?Patient ID: Ebony Hatfield, female    DOB: Feb 07, 1951,  MRN: [id number] ? ?No chief complaint on file. ? ? ?71 y.o. female presents with the above complaint.  Patient presents with complaint of right heel pain.  Patient states is painful to touch is progressive gotten worse.  Hurts with ambulation hurts right in the central part of the heel.  Is been on for few months that seems to be more more worse.  She denies seeing anyone else prior to seeing me.  She states that nothing has helped.  Pain scale 7 out of 10.  Both of her feet are hurting however right is much greater than left side left side may be due to compensatory mechanism ? ? ?Review of Systems: Negative except as noted in the HPI. Denies N/V/F/Ch. ? ?Past Medical History:  ?Diagnosis Date  ? Hyperlipidemia   ? Thyroid disease   ? ? ?Current Outpatient Medications:  ?  amLODipine (NORVASC) 5 MG tablet, Take 5 mg by mouth daily., Disp: , Rfl: 5 ?  fexofenadine (ALLEGRA) 180 MG tablet, Take 180 mg by mouth as needed. , Disp: , Rfl:  ?  levothyroxine (SYNTHROID, LEVOTHROID) 88 MCG tablet, Take 88 mcg by mouth daily before breakfast., Disp: , Rfl:  ?  PROAIR HFA 108 (90 BASE) MCG/ACT inhaler, Inhale into the lungs every 4 (four) hours as needed. , Disp: , Rfl:  ?  rosuvastatin (CRESTOR) 5 MG tablet, TAKE 1 TABLET BY MOUTH EVERY DAY OR AS TOLERATED, Disp: 90 tablet, Rfl: 2 ? ?Social History  ? ?Tobacco Use  ?Smoking Status Former  ?Smokeless Tobacco Never  ?Tobacco Comments  ? Quit 35 yrs ago  ? ? ?Allergies  ?Allergen Reactions  ? Lipitor [Atorvastatin]   ?  Muscle aches  ? Livalo [Pitavastatin]   ?  LEG CRAMPS  ? Pravachol [Pravastatin]   ?  Muscle aches  ? Statins   ?  Other reaction(s): Myalgias (intolerance)  ? ?Objective:  ?There were no vitals filed for this visit. ?There is no height or weight on file to calculate BMI. ?Constitutional Well developed. ?Well nourished.  ?Vascular Dorsalis pedis pulses palpable bilaterally. ?Posterior tibial  pulses palpable bilaterally. ?Capillary refill normal to all digits.  ?No cyanosis or clubbing noted. ?Pedal hair growth normal.  ?Neurologic Normal speech. ?Oriented to person, place, and time. ?Epicritic sensation to light touch grossly present bilaterally.  ?Dermatologic Nails well groomed and normal in appearance. ?No open wounds. ?No skin lesions.  ?Orthopedic: Normal joint ROM without pain or crepitus bilaterally. ?No visible deformities. ?Tender to palpation at the calcaneal tuber bilaterally. ?No pain with calcaneal squeeze bilaterally. ?Ankle ROM diminished range of motion bilaterally. ?Silfverskiold Test: positive bilaterally.  ? ?Radiographs: Taken and reviewed. No acute fractures or dislocations. No evidence of stress fracture.  Plantar heel spur present. Posterior heel spur absent.  ? ?Assessment:  ? ?1. Plantar fasciitis of right foot   ?2. Plantar fasciitis of left foot   ? ?Plan:  ?Patient was evaluated and treated and all questions answered. ? ?Left Planter fasciitis ?-Clinically her pain is very mild therefore I will hold off on treating if this is likely due to compensation on the right side.  If it gets worse we will discuss options during next medical visit.  She states understanding ? ? ?Plantar Fasciitis, right ?- XR reviewed as above.  ?- Educated on icing and stretching. Instructions given.  ?- Injection delivered to the plantar fascia as below. ?- DME: Plantar fascial  brace dispensed to support the medial longitudinal arch of the foot and offload pressure from the heel and prevent arch collapse during weightbearing ?- Pharmacologic management: None ? ?Procedure: Injection Tendon/Ligament ?Location: Right plantar fascia at the glabrous junction; medial approach. ?Skin Prep: alcohol ?Injectate: 0.5 cc 0.5% marcaine plain, 0.5 cc of 1% Lidocaine, 0.5 cc kenalog 10. ?Disposition: Patient tolerated procedure well. Injection site dressed with a band-aid. ? ?No follow-ups on file. ?

## 2022-01-21 ENCOUNTER — Ambulatory Visit: Payer: Medicare Other | Admitting: Podiatry

## 2022-01-21 ENCOUNTER — Encounter: Payer: Self-pay | Admitting: Podiatry

## 2022-01-21 DIAGNOSIS — Q666 Other congenital valgus deformities of feet: Secondary | ICD-10-CM | POA: Diagnosis not present

## 2022-01-21 DIAGNOSIS — M722 Plantar fascial fibromatosis: Secondary | ICD-10-CM | POA: Diagnosis not present

## 2022-01-21 NOTE — Progress Notes (Signed)
?Subjective:  ?Patient ID: Ebony Hatfield, female    DOB: 12-Oct-1950,  MRN: [id number] ? ?Chief Complaint  ?Patient presents with  ? Plantar Fasciitis  ? ? ?71 y.o. female presents with the above complaint.  Patient presents for follow-up of right Planter fasciitis.  She states she is doing a lot better.  She stated about 80% improved.  She still has some residual pain.  She would like to discuss next treatment plan.  She was unable to obtain power steps ? ? ?Review of Systems: Negative except as noted in the HPI. Denies N/V/F/Ch. ? ?Past Medical History:  ?Diagnosis Date  ? Hyperlipidemia   ? Thyroid disease   ? ? ?Current Outpatient Medications:  ?  amLODipine (NORVASC) 5 MG tablet, Take 5 mg by mouth daily., Disp: , Rfl: 5 ?  fexofenadine (ALLEGRA) 180 MG tablet, Take 180 mg by mouth as needed. , Disp: , Rfl:  ?  levothyroxine (SYNTHROID, LEVOTHROID) 88 MCG tablet, Take 88 mcg by mouth daily before breakfast., Disp: , Rfl:  ?  PROAIR HFA 108 (90 BASE) MCG/ACT inhaler, Inhale into the lungs every 4 (four) hours as needed. , Disp: , Rfl:  ?  rosuvastatin (CRESTOR) 5 MG tablet, TAKE 1 TABLET BY MOUTH EVERY DAY OR AS TOLERATED, Disp: 90 tablet, Rfl: 2 ? ?Social History  ? ?Tobacco Use  ?Smoking Status Former  ?Smokeless Tobacco Never  ?Tobacco Comments  ? Quit 35 yrs ago  ? ? ?Allergies  ?Allergen Reactions  ? Lipitor [Atorvastatin]   ?  Muscle aches  ? Livalo [Pitavastatin]   ?  LEG CRAMPS  ? Pravachol [Pravastatin]   ?  Muscle aches  ? Statins   ?  Other reaction(s): Myalgias (intolerance)  ? ?Objective:  ?There were no vitals filed for this visit. ?There is no height or weight on file to calculate BMI. ?Constitutional Well developed. ?Well nourished.  ?Vascular Dorsalis pedis pulses palpable bilaterally. ?Posterior tibial pulses palpable bilaterally. ?Capillary refill normal to all digits.  ?No cyanosis or clubbing noted. ?Pedal hair growth normal.  ?Neurologic Normal speech. ?Oriented to person, place, and  time. ?Epicritic sensation to light touch grossly present bilaterally.  ?Dermatologic Nails well groomed and normal in appearance. ?No open wounds. ?No skin lesions.  ?Orthopedic: Normal joint ROM without pain or crepitus bilaterally. ?No visible deformities. ?Tender to palpation at the calcaneal tuber bilaterally. ?No pain with calcaneal squeeze bilaterally. ?Ankle ROM diminished range of motion bilaterally. ?Silfverskiold Test: positive bilaterally.  ? ?Radiographs: Taken and reviewed. No acute fractures or dislocations. No evidence of stress fracture.  Plantar heel spur present. Posterior heel spur absent.  ? ?Assessment:  ? ?1. Plantar fasciitis of right foot   ?2. Pes planovalgus   ? ? ?Plan:  ?Patient was evaluated and treated and all questions answered. ? ?Left Planter fasciitis ?-Clinically her pain is very mild therefore I will hold off on treating if this is likely due to compensation on the right side.  If it gets worse we will discuss options during next medical visit.  She states understanding ? ? ?Plantar Fasciitis, right ?- XR reviewed as above.  ?- Educated on icing and stretching. Instructions given.  ?-Second injection delivered to the plantar fascia as below. ?- DME: Plantar fascial brace dispensed to support the medial longitudinal arch of the foot and offload pressure from the heel and prevent arch collapse during weightbearing ?- Pharmacologic management: None ? ?Pes planovalgus ?-I explained the patient the etiology of pes planovalgus and regimen options  were discussed I discussed custom orthotics versus over-the-counter orthotics.  She will benefit from power steps insoles to help address and support the arch of her foot.  Power steps insoles were dispensed. ? ?Procedure: Injection Tendon/Ligament ?Location: Right plantar fascia at the glabrous junction; medial approach. ?Skin Prep: alcohol ?Injectate: 0.5 cc 0.5% marcaine plain, 0.5 cc of 1% Lidocaine, 0.5 cc kenalog 10. ?Disposition:  Patient tolerated procedure well. Injection site dressed with a band-aid. ? ?No follow-ups on file. ?

## 2022-02-25 ENCOUNTER — Ambulatory Visit: Payer: Medicare Other | Admitting: Podiatry

## 2022-02-25 DIAGNOSIS — M722 Plantar fascial fibromatosis: Secondary | ICD-10-CM | POA: Diagnosis not present

## 2022-02-27 NOTE — Progress Notes (Signed)
Subjective:  Patient ID: Ebony Hatfield, female    DOB: Sep 29, 1950,  MRN: [id number]  Chief Complaint  Patient presents with   Plantar Fasciitis    71 y.o. female presents with the above complaint.  Patient presents for follow-up of right Planter fasciitis.  She states she is doing a lot better.  She states she is 9095% improved.  She has sometimes few days but overall doing much better with insoles and injections   Review of Systems: Negative except as noted in the HPI. Denies N/V/F/Ch.  Past Medical History:  Diagnosis Date   Hyperlipidemia    Thyroid disease     Current Outpatient Medications:    amLODipine (NORVASC) 5 MG tablet, Take 5 mg by mouth daily., Disp: , Rfl: 5   fexofenadine (ALLEGRA) 180 MG tablet, Take 180 mg by mouth as needed. , Disp: , Rfl:    levothyroxine (SYNTHROID, LEVOTHROID) 88 MCG tablet, Take 88 mcg by mouth daily before breakfast., Disp: , Rfl:    PROAIR HFA 108 (90 BASE) MCG/ACT inhaler, Inhale into the lungs every 4 (four) hours as needed. , Disp: , Rfl:    rosuvastatin (CRESTOR) 5 MG tablet, TAKE 1 TABLET BY MOUTH EVERY DAY OR AS TOLERATED, Disp: 90 tablet, Rfl: 2  Social History   Tobacco Use  Smoking Status Former  Smokeless Tobacco Never  Tobacco Comments   Quit 35 yrs ago    Allergies  Allergen Reactions   Lipitor [Atorvastatin]     Muscle aches   Livalo [Pitavastatin]     LEG CRAMPS   Pravachol [Pravastatin]     Muscle aches   Statins     Other reaction(s): Myalgias (intolerance)   Objective:  There were no vitals filed for this visit. There is no height or weight on file to calculate BMI. Constitutional Well developed. Well nourished.  Vascular Dorsalis pedis pulses palpable bilaterally. Posterior tibial pulses palpable bilaterally. Capillary refill normal to all digits.  No cyanosis or clubbing noted. Pedal hair growth normal.  Neurologic Normal speech. Oriented to person, place, and time. Epicritic sensation to light  touch grossly present bilaterally.  Dermatologic Nails well groomed and normal in appearance. No open wounds. No skin lesions.  Orthopedic: Normal joint ROM without pain or crepitus bilaterally. No visible deformities. No further tender to palpation at the calcaneal tuber bilaterally. No pain with calcaneal squeeze bilaterally. Ankle ROM diminished range of motion bilaterally. Silfverskiold Test: positive bilaterally.   Radiographs: Taken and reviewed. No acute fractures or dislocations. No evidence of stress fracture.  Plantar heel spur present. Posterior heel spur absent.   Assessment:   No diagnosis found.   Plan:  Patient was evaluated and treated and all questions answered.  Left Planter fasciitis -Clinically her pain is very mild therefore I will hold off on treating if this is likely due to compensation on the right side.  If it gets worse we will discuss options during next medical visit.  She states understanding   Plantar Fasciitis, right -Clinically healed.  At this time I discussed shoe gear modification importance of power steps.  She states understanding.  If any foot and ankle issues arise in future advised her to come back and see me.  Pes planovalgus -I explained the patient the etiology of pes planovalgus and regimen options were discussed I discussed custom orthotics versus over-the-counter orthotics.  She will benefit from power steps insoles to help address and support the arch of her foot.  Power steps insoles were dispensed.  No follow-ups on file.

## 2022-05-22 ENCOUNTER — Other Ambulatory Visit: Payer: Self-pay | Admitting: Obstetrics and Gynecology

## 2022-05-29 ENCOUNTER — Other Ambulatory Visit: Payer: Self-pay | Admitting: Family Medicine

## 2022-05-29 DIAGNOSIS — N644 Mastodynia: Secondary | ICD-10-CM

## 2022-06-11 ENCOUNTER — Ambulatory Visit
Admission: RE | Admit: 2022-06-11 | Discharge: 2022-06-11 | Disposition: A | Discharge status: Home / Self Care | Payer: 59 | Source: Ambulatory Visit | Attending: Family Medicine | Admitting: Family Medicine

## 2022-06-11 ENCOUNTER — Ambulatory Visit
Admission: RE | Admit: 2022-06-11 | Discharge: 2022-06-11 | Disposition: A | Discharge status: Home / Self Care | Payer: Medicare Other | Source: Ambulatory Visit | Attending: Family Medicine | Admitting: Family Medicine

## 2022-06-11 DIAGNOSIS — N644 Mastodynia: Secondary | ICD-10-CM

## 2022-10-12 ENCOUNTER — Other Ambulatory Visit: Payer: Self-pay | Admitting: Family Medicine

## 2022-10-12 DIAGNOSIS — M79606 Pain in leg, unspecified: Secondary | ICD-10-CM

## 2022-10-20 ENCOUNTER — Ambulatory Visit
Admission: RE | Admit: 2022-10-20 | Discharge: 2022-10-20 | Disposition: A | Discharge status: Home / Self Care | Payer: Medicare Other | Source: Ambulatory Visit | Attending: Family Medicine | Admitting: Family Medicine

## 2022-10-20 DIAGNOSIS — M79606 Pain in leg, unspecified: Secondary | ICD-10-CM

## 2023-09-29 ENCOUNTER — Encounter (HOSPITAL_BASED_OUTPATIENT_CLINIC_OR_DEPARTMENT_OTHER): Payer: Self-pay | Admitting: Obstetrics and Gynecology

## 2023-09-30 ENCOUNTER — Encounter (HOSPITAL_BASED_OUTPATIENT_CLINIC_OR_DEPARTMENT_OTHER): Payer: Self-pay | Admitting: Obstetrics and Gynecology

## 2023-09-30 NOTE — Progress Notes (Addendum)
Spoke w/ via phone for pre-op interview--- pt Lab needs dos----   no      Lab results------ lab app 10-05-2023 @ 1145 getting CBC/ BMP/ T&S/ EKG COVID test -----patient states asymptomatic no test needed Arrive at ------- 0530 on 10-11-2023 NPO after MN  w/ exception sips of water w/ meds Med rec completed Medications to take morning of surgery ----- norvasc, synthroid, crestor Diabetic medication ----- n/a Patient instructed no nail polish to be worn day of surgery Patient instructed to bring photo id and insurance card day of surgery Patient aware to have Driver (ride ) / caregiver    for 24 hours after surgery - husband, Ebony Hatfield Patient Special Instructions ----- will pick up bag w/ hibiclens and written instructions at lab appt. Asked to bring rescue inhaler dos and call with any questions Pre-Op special Instructions ----- sent inbox message in epic to dr Rana Snare , requested pre-op orders on 09-29-2023 Patient verbalized understanding of instructions that were given at this phone interview. Patient denies chest pain, sob, fever, cough at the interview.

## 2023-09-30 NOTE — Progress Notes (Signed)
Your procedure is scheduled on :   Monday,  10-11-2023  Report to [facility] AT  __5:30_ AM.   Call this number if you have problems the morning of surgery:  [phone] Any questions prior to surgery call pre-op nurse,  Reason Helzer :  [phone]   OUR ADDRESS IS [address].  [facility]ATED [facility]  PLEASE BRING YOUR INSURANCE CARD AND PHOTO ID DAY OF SURGERY.                                     REMEMBER: Do not eat any food and do not drink any liquid after midnight night before surgery. This includes no water,  candy,  gum,  and mints.   Please brush your teeth morning of surgery and rinse mouth out _____________________________________________________________________     TAKE ONLY THESE MEDICATIONS MORNING OF SURGERY:  May take with sips of water Levothyroxine (synthroid) Amlodipine (norvasc) Rosuvastatin (crestor) Please bring your Albuterol (ventolin) inhaler with you day of surgery                                       DO NOT WEAR JEWERLY/  METAL/  PIERCINGS (INCLUDING NO PLASTIC PIERCINGS) DO NOT WEAR LOTIONS, POWDERS, PERFUMES OR NAIL POLISH ON YOUR FINGERNAILS. TOENAIL POLISH IS OK TO WEAR. DO NOT SHAVE FOR 48 HOURS PRIOR TO DAY OF SURGERY.  CONTACTS, GLASSES, OR DENTURES MAY NOT BE WORN TO SURGERY.  REMEMBER: NO SMOKING, VAPING ,  DRUGS OR ALCOHOL FOR 24 HOURS BEFORE YOUR SURGERY.                                    [facility] IS NOT RESPONSIBLE  FOR ANY BELONGINGS.                                                                    Marland Kitchen           [facility] - Preparing for Surgery Before surgery, you can play an important role.  Because skin is not sterile, your skin needs to be as free of germs as possible.  You can reduce the number of germs on your skin by washing with CHG (chlorahexidine gluconate) soap before surgery.  CHG is an antiseptic cleaner which kills germs and bonds with the skin to continue killing  germs even after washing. Please DO NOT use if you have an allergy to CHG or antibacterial soaps.  If your skin becomes reddened/irritated stop using the CHG and inform your nurse when you arrive at Short Stay. Do not shave (including legs and underarms) for at least 48 hours prior to the first CHG shower.  You may shave your face/neck. Please follow these instructions carefully:  1.  Shower with CHG Soap the night before surgery and the  morning of Surgery.  2.  If you choose to wash your hair, wash your hair first as usual with your  normal  shampoo.  3.  After you shampoo, rinse your hair and body thoroughly to remove the  shampoo.                                        4.  Use CHG as you would any other liquid soap.  You can apply chg directly  to the skin and wash , chg soap provided, night before and morning of your surgery.  5.  Apply the CHG Soap to your body ONLY FROM THE NECK DOWN.   Do not use on face/ open                           Wound or open sores. Avoid contact with eyes, ears mouth and genitals (private parts).                       Wash face,  Genitals (private parts) with your normal soap.             6.  Wash thoroughly, paying special attention to the area where your surgery  will be performed.  7.  Thoroughly rinse your body with warm water from the neck down.  8.  DO NOT shower/wash with your normal soap after using and rinsing off  the CHG Soap.             9.  Pat yourself dry with a clean towel.            10.  Wear clean pajamas.            11.  Place clean sheets on your bed the night of your first shower and do not  sleep with pets. Day of Surgery : Do not apply any lotions/ powders the morning of surgery.  Please wear clean clothes to the hospital/surgery center.  IF YOU HAVE ANY SKIN IRRITATION OR PROBLEMS WITH THE SURGICAL SOAP, PLEASE GET A BAR OF GOLD DIAL SOAP AND SHOWER THE NIGHT BEFORE YOUR SURGERY AND THE MORNING OF YOUR SURGERY. PLEASE LET THE NURSE KNOW  MORNING OF YOUR SURGERY IF YOU HAD ANY PROBLEMS WITH THE SURGICAL SOAP.   YOUR SURGEON MAY HAVE REQUESTED EXTENDED RECOVERY TIME AFTER YOUR SURGERY. IT COULD BE A  JUST A FEW HOURS  UP TO AN OVERNIGHT STAY.  YOUR SURGEON SHOULD HAVE DISCUSSED THIS WITH YOU PRIOR TO YOUR SURGERY. IN THE EVENT YOU NEED TO STAY OVERNIGHT PLEASE REFER TO THE FOLLOWING GUIDELINES. YOU MAY HAVE UP TO 4 VISITORS  MAY VISIT IN THE EXTENDED RECOVERY ROOM UNTIL 800 PM ONLY.  ONE  VISITOR AGE 46 AND OVER MAY SPEND THE NIGHT AND MUST BE IN EXTENDED RECOVERY ROOM NO LATER THAN 800 PM . YOUR DISCHARGE TIME AFTER YOU SPEND THE NIGHT IS 900 AM THE MORNING AFTER YOUR SURGERY. YOU MAY PACK A SMALL OVERNIGHT BAG WITH TOILETRIES FOR YOUR OVERNIGHT STAY IF YOU WISH.  REGARDLESS OF IF YOU STAY OVER NIGHT OR ARE DISCHARGED THE SAME DAY YOU WILL BE REQUIRED TO HAVE A RESPONSIBLE ADULT (18 YRS OLD OR OLDER) STAY WITH YOU FOR AT LEAST THE FIRST 24 HOURS WHEN HOME  YOUR PRESCRIPTION MEDICATIONS WILL BE PROVIDED DURING [facility] STAY.  ________________________________________________________________________

## 2023-10-05 ENCOUNTER — Encounter (HOSPITAL_COMMUNITY)
Admission: RE | Admit: 2023-10-05 | Discharge: 2023-10-05 | Disposition: A | Discharge status: Home / Self Care | Payer: Medicare Other | Source: Ambulatory Visit | Attending: Obstetrics and Gynecology | Admitting: Obstetrics and Gynecology

## 2023-10-05 DIAGNOSIS — I498 Other specified cardiac arrhythmias: Secondary | ICD-10-CM | POA: Insufficient documentation

## 2023-10-05 DIAGNOSIS — Z01818 Encounter for other preprocedural examination: Secondary | ICD-10-CM | POA: Insufficient documentation

## 2023-10-05 LAB — BASIC METABOLIC PANEL
Anion gap: 9 (ref 5–15)
BUN: 18 mg/dL (ref 8–23)
CO2: 23 mmol/L (ref 22–32)
Calcium: 9.6 mg/dL (ref 8.9–10.3)
Chloride: 105 mmol/L (ref 98–111)
Creatinine, Ser: 0.66 mg/dL (ref 0.44–1.00)
GFR, Estimated: >60 mL/min (ref >60)
Glucose, Bld: 97 mg/dL (ref 70–99)
Potassium: 4.4 mmol/L (ref 3.5–5.1)
Sodium: 137 mmol/L (ref 135–145)

## 2023-10-05 LAB — CBC
HCT: 41.5 % (ref 36.0–46.0)
Hemoglobin: 13.3 g/dL (ref 12.0–15.0)
MCH: 29.8 pg (ref 26.0–34.0)
MCHC: 32 g/dL (ref 30.0–36.0)
MCV: 92.8 fL (ref 80.0–100.0)
Platelets: 288 10*3/uL (ref 150–400)
RBC: 4.47 MIL/uL (ref 3.87–5.11)
RDW: 12.6 % (ref 11.5–15.5)
WBC: 6.2 10*3/uL (ref 4.0–10.5)
nRBC: 0 % (ref 0.0–0.2)

## 2023-10-07 NOTE — H&P (Addendum)
Ebony Hatfield is an 73 y.o. female continues to have prolapse sxs which prevents her from being active no SUI or digitilization wants to proceed with surgical repair Patient's Care Team Primary Care Provider: Blenda Bridegroom MD: [address], [place], [place] [address], Ph [phone], Fax [phone] NPI: [id number] Patient's Pharmacies [facility] [phone] [facility]): [address] [place], [place] [address], Ph [phone], Fax [phone] Vitals Wt: 129.8 lbs 09/29/2023 11:11 am Ht: 5 ft 2.5 in 09/29/2023 11:07 am BMI: 23.4 09/29/2023 11:11 am BP: 120/68 09/29/2023 11:10 am Allergies Allergies not reviewed (last reviewed 12/23/2022) NKDA Medications Reviewed Medications albuterol sulfate HFA 90 mcg/actuation aerosol inhaler INHALE 1 PUFF INTO THE LUNGS EVERY 4 HOURS AS NEEDED 02/08/23   filled surescripts Allegra Allergy 12/23/22   entered Eliseo Squires Harris amLODIPine 5 mg tablet TAKE 1 TABLET BY MOUTH EVERY DAY FOR 90 DAYS 07/11/23   filled surescripts levothyroxine 88 mcg tablet TAKE 1 TABLET BY MOUTH EVERY DAY 07/13/23   filled surescripts rosuvastatin 5 mg tablet TAKE 1 TABLET BY MOUTH EVERY DAY 08/28/23   filled surescripts Vaccines Vaccines not reviewed (last reviewed 10/10/2018) Vaccine Type Date Amt. Route Site NDC Lot # Mfr. Exp. Date VIS VIS Given Vaccinator Influenza influenza, injectable, quadrivalent, preservative free 10/11/18  Intramuscular Arm, Right Upper  [id number] Other manufacturer 02/18/19 Inactivated Influenza 04/28/2018 10/10/18 Anselm Jungling Problems Reviewed Problems No known problems Family History Reviewed Family History Maternal Grandmother - Malignant tumor of breast Social History Reviewed Social History Marriage and Sexuality What is your relationship status?: Married Are you sexually active?: No Substance Use Do you or have you ever smoked tobacco?: Former smoker What was the date of your most recent tobacco  screening?: 12/23/2022 What is your level of alcohol consumption?: Moderate Do you use any illicit or recreational drugs?: No Diet and Exercise What is your exercise level?: Occasional Education and Occupation Are you currently employed?: No (Notes: Retired) Routine Gyn History of domestic violence: No Other Marital status: Married Gender Identity and LGBTQ Identity Sexual orientation: Straight or heterosexual Surgical History Surgical History not reviewed (last reviewed 12/23/2022) Postpartum ligation of fallopian tubes - 1977 Vaginal delivery of fetus - 1977 Vaginal delivery of fetus - 1974 Vaginal delivery of fetus - 1972 GYN History GYN History not reviewed (last reviewed 12/23/2022) Date of LMP: (Notes: PMP). Date of Last Pap Smear: 12/23/2022 (Notes: ASCUS -HPV/DUE 2025 (Medicare).). Date of Last HPV Test: 12/23/2022 (Notes: NEG). History of Abnormal PAP: N. Date of Last Mammogram: (Notes: 10/2022/TBC/Negative.). Date of Last Colonoscopy: (Notes: 2022/WNL/[facility].). Date of last bone density: 04/05/23 Osteopenia (Notes: Osteopenia). Current Birth Control Method:: Tubal Ligation. Obstetric History Reviewed Obstetric History TOTAL FULL PRE AB. I AB. S ECTOPICS MULTIPLE LIVING 3 3 0 0 0 0 0 3 Past Medical History Past Medical History not reviewed (last reviewed 10/10/2018) Endocrinology- Hypothyroidism: Y GI-Other: Y - Diverticulosis Pulmonary- Asthma: Y Screening None recorded. ROS Constitutional: Constitutional: no significant weight gain or loss and no fatigue or fever.  Skin: Skin: no rashes or abnormal moles.  Respiratory: Respiratory: no wheezing or dyspnea / shortness of breath.  Cardiovascular: Cardiovascular: no palpitations or chest pain.  Gastrointestinal: Gastrointestinal: no heartburn, dysphagia, nausea, vomiting, diarrhea, constipation, abdominal pain, bowel movement changes, or rectal bleeding.  Genitourinary: Genitourinary: no vaginal odor or  itching and no hematuria, incontinence, rash, lesion, discharge, abnormal bleeding, flank pain, or trouble urinating.  Endocrine: Menstrual: no menstrual problems or PMDD symptoms. Menopausal: no menopausal symptoms. Sexual: no  sexual problems.  Musculoskeletal: Musculoskeletal: no muscle aches or weakness and no arthralgias/joint pain or back pain.  Neurological: Neurologic: no headaches, dizziness, LOC, weakness, or numbness.  Psychological: Psych: no depression, alcoholism, or sleep disturbances. Physical Exam Constitutional: *General Appearance: healthy-appearing, well-nourished, and well-developed.  Head: Head: normocephalic.  Neck: *Thyroid: non-tender and no enlargement.  Lymph Nodes: *Palpation: normal.  Cardiovascular: *Auscultation: RRR and no murmur. *Peripheral Vascular: no varicosities or edema.  Lungs: *Auscultation: clear to auscultation. Inspection: normal and normal respiratory rate.  *Breast: Bilateral: no tenderness, skin changes, abnormal nipple secretions, or masses palpable and nipple appearance: normal. Right Breast: normal. Left Breast: normal.  Abdomen: *Inspection/Palpation/Auscultation: no tenderness, rebound, guarding, hepatomegaly, or splenomegaly and non-distended and soft. *Hernia: none palpated.  Back: Appearance normal. Palpation no costovertebral angle tenderness.  Female Genitalia: Vulva: no masses, atrophy, or lesions. Mons: no erythema, excoriation, atrophy, lesions, masses, swelling, tenderness, or vesicles/ ulcers and normal. Labia Majora: no erythema, excoriation, atrophy, discoloration, lesions, masses, swelling, tenderness, or vesicles/ ulcers and normal. Labia Minora: no erythema, excoriation, atrophy, discoloration, lesions, vesicles, masses, swelling, or tenderness and normal. Introitus: normal. Bartholin's Gland: normal. *Vagina: no discharge, erythema, atrophy, lesions, ulcers, swelling, tenderness, prolapse, or blood present and cystocele  stage 2 and rectocele stage 2. *Cervix: no lesions, discharge, bleeding, or cervical motion tenderness and grossly normal. *Uterus: normal size and contour and midline, mobile, non-tender, and uterine prolapse stage 2. *Urethral Meatus/ Urethra: no discharge, masses, or tenderness and normal meatus and well supported urethra. *Bladder: non-distended, non-tender, and no palpable mass. *Adnexa/Parametria: no tenderness or mass palpable.  Extremities: Legs: normal.  Skin: *Appearance: no rashes or lesions.  Psychiatric: *Orientation: to person, place, and time. *Mood and Affect: normal mood and affect and active and alert.     Past Medical History:  Diagnosis Date   Asthma    followed by pcp   (09-30-2023  pt stated last used inhaler one month ago)   Cystocele with rectocele    Hyperlipidemia, mixed    evaluated by cardiology--- dr Melburn Popper (lov in epic 09-21-2018)  due to intolerant to statins;   normal myoview in epic 08-19-2012/  essentially normal echo 08-16-2012   Hypertension    Hypothyroidism    followed by pcp   Incomplete uterovaginal prolapse    Prolapse of female pelvic organs    SUI (stress urinary incontinence, female)    Wears glasses     Past Surgical History:  Procedure Laterality Date   CATARACT EXTRACTION W/ INTRAOCULAR LENS IMPLANT Bilateral 2022   COLONOSCOPY  2022   TUBAL LIGATION Bilateral 1977   PPTL   WISDOM TOOTH EXTRACTION  2010    Family History  Problem Relation Age of Onset   Breast cancer Maternal Aunt 60   Breast cancer Maternal Grandmother 87    Social History:  reports that she has quit smoking. Her smoking use included cigarettes. She has never used smokeless tobacco. She reports that she does not currently use alcohol. She reports that she does not use drugs.  Allergies:  Allergies  Allergen Reactions   Lipitor [Atorvastatin]     Muscle aches/ leg pain   Livalo [Pitavastatin]     LEG CRAMPS   Pravastatin     Muscle aches/ leg pain    Statins     Joint pain   Welchol [Colesevelam] Diarrhea    No medications prior to admission.    Assessment/Plan:  Assessment / Plan 1. Uterine prolapse - 2. Prolapse of female genital organs - Discussed  uterine prolapse, cytocele, recotocele. Options are expectant, pessary placement (discussed fitting and wearing), or surgical options. Discussed surgery at length 3. Cystocele and rectocele co-occurrent with incomplete uterovaginal prolapse   She now wants to proceed with surgical options rec LAVH/BSO/A&P repair with SSLS  Discussed the risks and benefits of the procedure including but not limited to infection, bleeding, damage to bowel, bladder, ureters, and risks associated with blood transfusion. We reviewed the procedure at length including the recovery. All here questions were answered and she gives informed consent.    Turner Daniels 10/07/2023, 6:52 AM  This patient has been seen and examined.   All of her questions were answered.  Labs and vital signs reviewed.  Informed consent has been obtained.  The History and Physical is current.

## 2023-10-08 NOTE — Progress Notes (Signed)
Talked with patient about time change on surgery schedule. To come in at 0730 instead of 0530. Can have clear liquids until 0630

## 2023-10-11 ENCOUNTER — Ambulatory Visit (HOSPITAL_BASED_OUTPATIENT_CLINIC_OR_DEPARTMENT_OTHER): Payer: Medicare Other | Admitting: Anesthesiology

## 2023-10-11 ENCOUNTER — Encounter (HOSPITAL_BASED_OUTPATIENT_CLINIC_OR_DEPARTMENT_OTHER): Payer: Self-pay | Admitting: Obstetrics and Gynecology

## 2023-10-11 ENCOUNTER — Other Ambulatory Visit: Payer: Self-pay

## 2023-10-11 ENCOUNTER — Encounter (HOSPITAL_BASED_OUTPATIENT_CLINIC_OR_DEPARTMENT_OTHER)
Admission: RE | Disposition: A | Discharge status: Home / Self Care | Payer: Self-pay | Source: Ambulatory Visit | Attending: Obstetrics and Gynecology

## 2023-10-11 ENCOUNTER — Observation Stay (HOSPITAL_BASED_OUTPATIENT_CLINIC_OR_DEPARTMENT_OTHER)
Admission: RE | Admit: 2023-10-11 | Discharge: 2023-10-12 | Disposition: A | Discharge status: Home / Self Care | Payer: Medicare Other | Source: Ambulatory Visit | Attending: Obstetrics and Gynecology | Admitting: Obstetrics and Gynecology

## 2023-10-11 DIAGNOSIS — J45909 Unspecified asthma, uncomplicated: Secondary | ICD-10-CM | POA: Diagnosis not present

## 2023-10-11 DIAGNOSIS — N814 Uterovaginal prolapse, unspecified: Secondary | ICD-10-CM

## 2023-10-11 DIAGNOSIS — E039 Hypothyroidism, unspecified: Secondary | ICD-10-CM | POA: Insufficient documentation

## 2023-10-11 DIAGNOSIS — Z01818 Encounter for other preprocedural examination: Principal | ICD-10-CM

## 2023-10-11 DIAGNOSIS — Z87891 Personal history of nicotine dependence: Secondary | ICD-10-CM | POA: Insufficient documentation

## 2023-10-11 DIAGNOSIS — N812 Incomplete uterovaginal prolapse: Principal | ICD-10-CM | POA: Insufficient documentation

## 2023-10-11 DIAGNOSIS — K660 Peritoneal adhesions (postprocedural) (postinfection): Secondary | ICD-10-CM | POA: Diagnosis not present

## 2023-10-11 HISTORY — PX: LAPAROSCOPIC VAGINAL HYSTERECTOMY WITH SALPINGO OOPHORECTOMY: SHX6681

## 2023-10-11 HISTORY — DX: Female genital prolapse, unspecified: N81.9

## 2023-10-11 HISTORY — PX: ANTERIOR AND POSTERIOR REPAIR WITH SACROSPINOUS FIXATION: SHX6536

## 2023-10-11 HISTORY — DX: Presence of spectacles and contact lenses: Z97.3

## 2023-10-11 HISTORY — DX: Hypothyroidism, unspecified: E03.9

## 2023-10-11 HISTORY — DX: Stress incontinence (female) (male): N39.3

## 2023-10-11 HISTORY — DX: Unspecified asthma, uncomplicated: J45.909

## 2023-10-11 HISTORY — DX: Mixed hyperlipidemia: E78.2

## 2023-10-11 HISTORY — DX: Cystocele, unspecified: N81.10

## 2023-10-11 HISTORY — DX: Essential (primary) hypertension: I10

## 2023-10-11 HISTORY — DX: Incomplete uterovaginal prolapse: N81.2

## 2023-10-11 LAB — TYPE AND SCREEN
ABO/RH(D): A POS
Antibody Screen: NEGATIVE

## 2023-10-11 LAB — ABO/RH: ABO/RH(D): A POS

## 2023-10-11 SURGERY — HYSTERECTOMY, VAGINAL, LAPAROSCOPY-ASSISTED, WITH SALPINGO-OOPHORECTOMY
Anesthesia: General | Site: Vagina

## 2023-10-11 MED ORDER — PHENYLEPHRINE 80 MCG/ML (10ML) SYRINGE FOR IV PUSH (FOR BLOOD PRESSURE SUPPORT)
PREFILLED_SYRINGE | INTRAVENOUS | Status: DC | PRN
  Administered 2023-10-11 (×2): 240 ug via INTRAVENOUS

## 2023-10-11 MED ORDER — FENTANYL CITRATE (PF) 100 MCG/2ML IJ SOLN
INTRAMUSCULAR | Status: AC
  Filled 2023-10-11: qty 2

## 2023-10-11 MED ORDER — LACTATED RINGERS IV SOLN: INTRAVENOUS | Status: DC

## 2023-10-11 MED ORDER — EPHEDRINE 5 MG/ML INJ
INTRAVENOUS | Status: AC
  Filled 2023-10-11: qty 5

## 2023-10-11 MED ORDER — LIDOCAINE 2% (20 MG/ML) 5 ML SYRINGE
INTRAMUSCULAR | Status: DC | PRN
  Administered 2023-10-11: 60 mg via INTRAVENOUS

## 2023-10-11 MED ORDER — DEXAMETHASONE SODIUM PHOSPHATE 10 MG/ML IJ SOLN
INTRAMUSCULAR | Status: AC
  Filled 2023-10-11: qty 1

## 2023-10-11 MED ORDER — LACTATED RINGERS IV SOLN: INTRAVENOUS | Status: AC

## 2023-10-11 MED ORDER — STERILE WATER FOR IRRIGATION IR SOLN
Status: DC | PRN
  Administered 2023-10-11: 1000 mL

## 2023-10-11 MED ORDER — ONDANSETRON HCL 4 MG/2ML IJ SOLN
INTRAMUSCULAR | Status: DC | PRN
  Administered 2023-10-11: 4 mg via INTRAVENOUS

## 2023-10-11 MED ORDER — LIDOCAINE HCL (PF) 2 % IJ SOLN
INTRAMUSCULAR | Status: AC
  Filled 2023-10-11: qty 20

## 2023-10-11 MED ORDER — DEXAMETHASONE SODIUM PHOSPHATE 10 MG/ML IJ SOLN
INTRAMUSCULAR | Status: DC | PRN
  Administered 2023-10-11: 5 mg via INTRAVENOUS

## 2023-10-11 MED ORDER — GLYCOPYRROLATE PF 0.2 MG/ML IJ SOSY
PREFILLED_SYRINGE | INTRAMUSCULAR | Status: AC
  Filled 2023-10-11: qty 1

## 2023-10-11 MED ORDER — SODIUM CHLORIDE 0.9 % IR SOLN
Status: DC | PRN
  Administered 2023-10-11: 1000 mL

## 2023-10-11 MED ORDER — EPHEDRINE SULFATE (PRESSORS) 50 MG/ML IJ SOLN
INTRAMUSCULAR | Status: DC | PRN
  Administered 2023-10-11 (×4): 5 mg via INTRAVENOUS

## 2023-10-11 MED ORDER — FENTANYL CITRATE (PF) 100 MCG/2ML IJ SOLN: 50.0000 ug | INTRAMUSCULAR | Status: DC | PRN

## 2023-10-11 MED ORDER — ZOLPIDEM TARTRATE 5 MG PO TABS: 5.0000 mg | ORAL_TABLET | Freq: Every evening | ORAL | Status: DC | PRN

## 2023-10-11 MED ORDER — ROCURONIUM BROMIDE 10 MG/ML (PF) SYRINGE
PREFILLED_SYRINGE | INTRAVENOUS | Status: AC
  Filled 2023-10-11: qty 10

## 2023-10-11 MED ORDER — OXYCODONE HCL 5 MG PO TABS: 5.0000 mg | ORAL_TABLET | Freq: Once | ORAL | Status: DC | PRN

## 2023-10-11 MED ORDER — MIDAZOLAM HCL 2 MG/2ML IJ SOLN
INTRAMUSCULAR | Status: AC
Start: 2023-10-11 — End: ?
  Filled 2023-10-11: qty 2

## 2023-10-11 MED ORDER — SODIUM CHLORIDE 0.9 % IV SOLN
INTRAVENOUS | Status: AC
  Filled 2023-10-11: qty 2

## 2023-10-11 MED ORDER — OXYCODONE HCL 5 MG/5ML PO SOLN: 5.0000 mg | Freq: Once | ORAL | Status: DC | PRN

## 2023-10-11 MED ORDER — ESTRADIOL 0.1 MG/GM VA CREA
TOPICAL_CREAM | VAGINAL | Status: DC | PRN
  Administered 2023-10-11: 1 via VAGINAL

## 2023-10-11 MED ORDER — SODIUM CHLORIDE 0.9% FLUSH: 3.0000 mL | Freq: Two times a day (BID) | INTRAVENOUS | Status: DC

## 2023-10-11 MED ORDER — CELECOXIB 200 MG PO CAPS
200.0000 mg | ORAL_CAPSULE | Freq: Once | ORAL | Status: AC
  Administered 2023-10-11: 200 mg via ORAL

## 2023-10-11 MED ORDER — ACETAMINOPHEN 500 MG PO TABS
1000.0000 mg | ORAL_TABLET | Freq: Once | ORAL | Status: AC
  Administered 2023-10-11: 1000 mg via ORAL

## 2023-10-11 MED ORDER — IBUPROFEN 200 MG PO TABS: 600.0000 mg | ORAL_TABLET | Freq: Four times a day (QID) | ORAL | Status: DC | PRN

## 2023-10-11 MED ORDER — FENTANYL CITRATE (PF) 100 MCG/2ML IJ SOLN
25.0000 ug | INTRAMUSCULAR | Status: DC | PRN
  Administered 2023-10-11 (×2): 25 ug via INTRAVENOUS

## 2023-10-11 MED ORDER — DEXMEDETOMIDINE HCL IN NACL 80 MCG/20ML IV SOLN
INTRAVENOUS | Status: AC
  Filled 2023-10-11: qty 40

## 2023-10-11 MED ORDER — SUGAMMADEX SODIUM 200 MG/2ML IV SOLN
INTRAVENOUS | Status: DC | PRN
  Administered 2023-10-11: 200 mg via INTRAVENOUS

## 2023-10-11 MED ORDER — ALUM & MAG HYDROXIDE-SIMETH 200-200-20 MG/5ML PO SUSP: 30.0000 mL | ORAL | Status: DC | PRN

## 2023-10-11 MED ORDER — BUPIVACAINE HCL (PF) 0.25 % IJ SOLN
INTRAMUSCULAR | Status: DC | PRN
  Administered 2023-10-11: 10 mL

## 2023-10-11 MED ORDER — DEXMEDETOMIDINE HCL IN NACL 200 MCG/50ML IV SOLN
INTRAVENOUS | Status: DC | PRN
  Administered 2023-10-11: 8 ug via INTRAVENOUS

## 2023-10-11 MED ORDER — OXYCODONE-ACETAMINOPHEN 5-325 MG PO TABS
1.0000 | ORAL_TABLET | ORAL | Status: DC | PRN
Start: 2023-10-11 — End: 2023-10-12

## 2023-10-11 MED ORDER — TRAMADOL HCL 50 MG PO TABS
ORAL_TABLET | ORAL | Status: AC
  Filled 2023-10-11: qty 1

## 2023-10-11 MED ORDER — ROCURONIUM BROMIDE 10 MG/ML (PF) SYRINGE
PREFILLED_SYRINGE | INTRAVENOUS | Status: DC | PRN
  Administered 2023-10-11: 10 mg via INTRAVENOUS
  Administered 2023-10-11: 60 mg via INTRAVENOUS

## 2023-10-11 MED ORDER — GLYCOPYRROLATE 0.2 MG/ML IJ SOLN
INTRAMUSCULAR | Status: DC | PRN
  Administered 2023-10-11: .1 mg via INTRAVENOUS

## 2023-10-11 MED ORDER — PROPOFOL 10 MG/ML IV BOLUS
INTRAVENOUS | Status: DC | PRN
  Administered 2023-10-11: 140 mg via INTRAVENOUS

## 2023-10-11 MED ORDER — SODIUM CHLORIDE 0.9% FLUSH: 3.0000 mL | INTRAVENOUS | Status: DC | PRN

## 2023-10-11 MED ORDER — TRANEXAMIC ACID-NACL 1000-0.7 MG/100ML-% IV SOLN
INTRAVENOUS | Status: DC | PRN
  Administered 2023-10-11: 1000 mg via INTRAVENOUS

## 2023-10-11 MED ORDER — TRAMADOL HCL 50 MG PO TABS
50.0000 mg | ORAL_TABLET | Freq: Four times a day (QID) | ORAL | Status: DC | PRN
  Administered 2023-10-11 – 2023-10-12 (×3): 50 mg via ORAL

## 2023-10-11 MED ORDER — POVIDONE-IODINE 10 % EX SWAB
2.0000 | Freq: Once | CUTANEOUS | Status: AC
  Administered 2023-10-11: 2 via TOPICAL

## 2023-10-11 MED ORDER — SODIUM CHLORIDE 0.9 % IV SOLN
2.0000 g | INTRAVENOUS | Status: AC
  Administered 2023-10-11: 2 g via INTRAVENOUS

## 2023-10-11 MED ORDER — CELECOXIB 200 MG PO CAPS
ORAL_CAPSULE | ORAL | Status: AC
  Filled 2023-10-11: qty 1

## 2023-10-11 MED ORDER — PHENYLEPHRINE 80 MCG/ML (10ML) SYRINGE FOR IV PUSH (FOR BLOOD PRESSURE SUPPORT)
PREFILLED_SYRINGE | INTRAVENOUS | Status: AC
  Filled 2023-10-11: qty 10

## 2023-10-11 MED ORDER — FENTANYL CITRATE (PF) 100 MCG/2ML IJ SOLN
INTRAMUSCULAR | Status: DC | PRN
  Administered 2023-10-11: 25 ug via INTRAVENOUS
  Administered 2023-10-11: 100 ug via INTRAVENOUS

## 2023-10-11 MED ORDER — ONDANSETRON HCL 4 MG/2ML IJ SOLN
INTRAMUSCULAR | Status: AC
  Filled 2023-10-11: qty 2

## 2023-10-11 MED ORDER — SIMETHICONE 80 MG PO CHEW: 80.0000 mg | CHEWABLE_TABLET | Freq: Four times a day (QID) | ORAL | Status: DC | PRN

## 2023-10-11 MED ORDER — PROPOFOL 10 MG/ML IV BOLUS
INTRAVENOUS | Status: AC
  Filled 2023-10-11: qty 20

## 2023-10-11 MED ORDER — TRANEXAMIC ACID-NACL 1000-0.7 MG/100ML-% IV SOLN
INTRAVENOUS | Status: AC
  Filled 2023-10-11: qty 100

## 2023-10-11 MED ORDER — MENTHOL 3 MG MT LOZG: 1.0000 | LOZENGE | OROMUCOSAL | Status: DC | PRN

## 2023-10-11 MED ORDER — ACETAMINOPHEN 500 MG PO TABS
ORAL_TABLET | ORAL | Status: AC
  Filled 2023-10-11: qty 2

## 2023-10-11 SURGICAL SUPPLY — 56 items
APPLICATOR ARISTA FLEXITIP XL (MISCELLANEOUS) IMPLANT
BLADE CLIPPER SENSICLIP SURGIC (BLADE) IMPLANT
BLADE SURG 15 STRL LF DISP TIS (BLADE) ×2 IMPLANT
CATH ROBINSON RED A/P 16FR (CATHETERS) ×2 IMPLANT
COVER BACK TABLE 60X90IN (DRAPES) ×2 IMPLANT
COVER MAYO STAND STRL (DRAPES) ×2 IMPLANT
DRAPE SURG IRRIG POUCH 19X23 (DRAPES) ×2 IMPLANT
DURAPREP 26ML APPLICATOR (WOUND CARE) ×2 IMPLANT
ELECT REM PT RETURN 9FT ADLT (ELECTROSURGICAL) ×2
ELECTRODE REM PT RTRN 9FT ADLT (ELECTROSURGICAL) IMPLANT
FORCEPS CUTTING 45CM 5MM (CUTTING FORCEPS) IMPLANT
GAUZE 4X4 16PLY ~~LOC~~+RFID DBL (SPONGE) ×2 IMPLANT
GAUZE STRIP PACKING 2INX5YD (MISCELLANEOUS) ×2 IMPLANT
GLOVE BIO SURGEON STRL SZ7 (GLOVE) IMPLANT
GLOVE BIOGEL PI IND STRL 6.5 (GLOVE) ×2 IMPLANT
GLOVE BIOGEL PI IND STRL 7.0 (GLOVE) ×2 IMPLANT
GLOVE ECLIPSE 6.5 STRL STRAW (GLOVE) ×2 IMPLANT
GLOVE SURG ORTHO 8.0 STRL STRW (GLOVE) ×6 IMPLANT
GLOVE SURG SS PI 7.5 STRL IVOR (GLOVE) IMPLANT
GOWN STRL REUS W/TWL LRG LVL3 (GOWN DISPOSABLE) ×8 IMPLANT
HEMOSTAT ARISTA ABSORB 3G PWDR (HEMOSTASIS) IMPLANT
HIBICLENS CHG 4% 4OZ (MISCELLANEOUS) IMPLANT
HOLDER FOLEY CATH W/STRAP (MISCELLANEOUS) ×2 IMPLANT
IRRIG SUCT STRYKERFLOW 2 WTIP (MISCELLANEOUS) ×2
IRRIGATION SUCT STRKRFLW 2 WTP (MISCELLANEOUS) IMPLANT
IV D5 NS 1000ML (IV SOLUTION) IMPLANT
KIT PINK PAD W/HEAD ARE REST (MISCELLANEOUS) ×2
KIT PINK PAD W/HEAD ARM REST (MISCELLANEOUS) ×2 IMPLANT
KIT TURNOVER CYSTO (KITS) ×2 IMPLANT
LEGGING LITHOTOMY PAIR STRL (DRAPES) IMPLANT
LIGASURE IMPACT 36 18CM CVD LR (INSTRUMENTS) ×2 IMPLANT
NDL INSUFFLATION 14GA 120MM (NEEDLE) ×2 IMPLANT
NDL MAYO 6 CRC TAPER PT (NEEDLE) ×2 IMPLANT
NEEDLE INSUFFLATION 14GA 120MM (NEEDLE) ×2
NEEDLE MAYO 6 CRC TAPER PT (NEEDLE) ×2
NS IRRIG 500ML POUR BTL (IV SOLUTION) ×2 IMPLANT
PACK LAVH (CUSTOM PROCEDURE TRAY) ×2 IMPLANT
PACK ROBOTIC GOWN (GOWN DISPOSABLE) ×6 IMPLANT
PACK VAGINAL WOMENS (CUSTOM PROCEDURE TRAY) ×2 IMPLANT
PAD OB MATERNITY 4.3X12.25 (PERSONAL CARE ITEMS) ×2 IMPLANT
PAD PREP 24X48 CUFFED NSTRL (MISCELLANEOUS) ×2 IMPLANT
SLEEVE SCD COMPRESS KNEE MED (STOCKING) ×2 IMPLANT
SPIKE FLUID TRANSFER (MISCELLANEOUS) IMPLANT
STRIP CLOSURE SKIN 1/4X4 (GAUZE/BANDAGES/DRESSINGS) IMPLANT
SUT MNCRL 0 MO-4 VIOLET 18 CR (SUTURE) ×4 IMPLANT
SUT MNCRL 0 VIOLET 6X18 (SUTURE) ×2 IMPLANT
SUT MON AB 2-0 CT1 36 (SUTURE) ×4 IMPLANT
SUT VICRYL 0 UR6 27IN ABS (SUTURE) ×2 IMPLANT
SUT VICRYL RAPIDE 3 0 (SUTURE) ×2 IMPLANT
SYR BULB IRRIG 60ML STRL (SYRINGE) ×2 IMPLANT
TOWEL OR 17X24 6PK STRL BLUE (TOWEL DISPOSABLE) ×4 IMPLANT
TRAY FOLEY W/BAG SLVR 14FR LF (SET/KITS/TRAYS/PACK) ×2 IMPLANT
TROCAR Z-THREAD BLADED 11X100M (TROCAR) ×2 IMPLANT
TROCAR Z-THREAD BLADED 5X100MM (TROCAR) ×2 IMPLANT
WARMER LAPAROSCOPE (MISCELLANEOUS) ×2 IMPLANT
WATER STERILE IRR 1000ML POUR (IV SOLUTION) IMPLANT

## 2023-10-11 NOTE — Transfer of Care (Signed)
Immediate Anesthesia Transfer of Care Note  Patient: Ebony Hatfield  Procedure(s) Performed: LAPAROSCOPIC ASSISTED VAGINAL HYSTERECTOMY WITH SALPINGO OOPHORECTOMY (Bilateral: Vagina ) ANTERIOR AND POSTERIOR REPAIR (Vagina )  Patient Location: PACU  Anesthesia Type:General  Level of Consciousness: awake and patient cooperative  Airway & Oxygen Therapy: Patient Spontanous Breathing  Post-op Assessment: Report given to RN and Post -op Vital signs reviewed and stable  Post vital signs: Reviewed and stable  Last Vitals:  Vitals Value Taken Time  BP    Temp    Pulse 84 10/11/23 1033  Resp 18 10/11/23 1033  SpO2 97 % 10/11/23 1033  Vitals shown include unfiled device data.  Last Pain:  Vitals:   10/11/23 0759  TempSrc: Oral  PainSc: 0-No pain         Complications: No notable events documented.

## 2023-10-11 NOTE — Anesthesia Preprocedure Evaluation (Addendum)
Anesthesia Evaluation  Patient identified by MRN, date of birth, ID band Patient awake    Reviewed: Allergy & Precautions, NPO status , Patient's Chart, lab work & pertinent test results  Airway Mallampati: II  TM Distance: >3 FB Neck ROM: Full    Dental no notable dental hx.    Pulmonary asthma , former smoker   Pulmonary exam normal        Cardiovascular hypertension,  Rhythm:Regular Rate:Normal     Neuro/Psych negative neurological ROS  negative psych ROS   GI/Hepatic negative GI ROS, Neg liver ROS,,,  Endo/Other  Hypothyroidism    Renal/GU negative Renal ROS  Female GU complaint     Musculoskeletal negative musculoskeletal ROS (+)    Abdominal Normal abdominal exam  (+)   Peds  Hematology Lab Results      Component                Value               Date                      WBC                      6.2                 10/05/2023                HGB                      13.3                10/05/2023                HCT                      41.5                10/05/2023                MCV                      92.8                10/05/2023                PLT                      288                 10/05/2023              Anesthesia Other Findings   Reproductive/Obstetrics                             Anesthesia Physical Anesthesia Plan  ASA: 2  Anesthesia Plan: General   Post-op Pain Management: Celebrex PO (pre-op)* and Tylenol PO (pre-op)*   Induction: Intravenous  PONV Risk Score and Plan: 3 and Ondansetron, Dexamethasone and Treatment may vary due to age or medical condition  Airway Management Planned: Mask and Oral ETT  Additional Equipment: None  Intra-op Plan:   Post-operative Plan: Extubation in OR  Informed Consent: I have reviewed the patients History and Physical, chart, labs and discussed the procedure including the risks, benefits and alternatives for the  proposed anesthesia with the patient  or authorized representative who has indicated his/her understanding and acceptance.     Dental advisory given  Plan Discussed with: CRNA  Anesthesia Plan Comments:        Anesthesia Quick Evaluation

## 2023-10-11 NOTE — Anesthesia Postprocedure Evaluation (Signed)
Anesthesia Post Note  Patient: Ebony Hatfield  Procedure(s) Performed: LAPAROSCOPIC ASSISTED VAGINAL HYSTERECTOMY WITH SALPINGO OOPHORECTOMY (Bilateral: Vagina ) ANTERIOR AND POSTERIOR REPAIR (Vagina )     Patient location during evaluation: PACU Anesthesia Type: General Level of consciousness: awake and alert Pain management: pain level controlled Vital Signs Assessment: post-procedure vital signs reviewed and stable Respiratory status: spontaneous breathing, nonlabored ventilation, respiratory function stable and patient connected to nasal cannula oxygen Cardiovascular status: blood pressure returned to baseline and stable Postop Assessment: no apparent nausea or vomiting Anesthetic complications: no   No notable events documented.  Last Vitals:  Vitals:   10/11/23 1245 10/11/23 1330  BP: 105/60 102/67  Pulse: 76 88  Resp: 18 16  Temp: 37 C 36.8 C  SpO2: 96% 99%    Last Pain:  Vitals:   10/11/23 1330  TempSrc:   PainSc: 0-No pain                 Earl Lites P Willi Borow[place]ak

## 2023-10-11 NOTE — Progress Notes (Signed)
Subjective: Patient reports tolerating PO.  Some flatus.  Minimal pain  Objective: I have reviewed patient's vital signs, intake and output, medications, and surgical findings .  BS hypoactive Abd soft nt, nd Bandages dry Packing removed and minimal bleeding noted   Assessment/Plan: Post op doing well Slowly advance diet.  Packing out.  Bladder training tomorrow.  LOS: 0 days    Turner Daniels, MD 10/11/2023, 6:32 PM

## 2023-10-11 NOTE — Anesthesia Procedure Notes (Signed)
Procedure Name: Intubation Date/Time: 10/11/2023 8:14 AM  Performed by: Bishop Limbo, CRNAPre-anesthesia Checklist: Patient identified, Emergency Drugs available, Suction available and Patient being monitored Patient Re-evaluated:Patient Re-evaluated prior to induction Oxygen Delivery Method: Circle System Utilized Preoxygenation: Pre-oxygenation with 100% oxygen Induction Type: IV induction Ventilation: Mask ventilation without difficulty Laryngoscope Size: Miller and 2 Grade View: Grade I Tube type: Oral Tube size: 7.0 mm Number of attempts: 1 Airway Equipment and Method: Stylet Placement Confirmation: ETT inserted through vocal cords under direct vision, positive ETCO2 and breath sounds checked- equal and bilateral Secured at: 22 cm Tube secured with: Tape Dental Injury: Teeth and Oropharynx as per pre-operative assessment

## 2023-10-12 ENCOUNTER — Encounter (HOSPITAL_BASED_OUTPATIENT_CLINIC_OR_DEPARTMENT_OTHER): Payer: Self-pay | Admitting: Obstetrics and Gynecology

## 2023-10-12 DIAGNOSIS — N812 Incomplete uterovaginal prolapse: Secondary | ICD-10-CM | POA: Diagnosis not present

## 2023-10-12 LAB — CBC
HCT: 33.3 % — ABNORMAL LOW (ref 36.0–46.0)
Hemoglobin: 10.7 g/dL — ABNORMAL LOW (ref 12.0–15.0)
MCH: 29.9 pg (ref 26.0–34.0)
MCHC: 32.1 g/dL (ref 30.0–36.0)
MCV: 93 fL (ref 80.0–100.0)
Platelets: 240 10*3/uL (ref 150–400)
RBC: 3.58 MIL/uL — ABNORMAL LOW (ref 3.87–5.11)
RDW: 12.8 % (ref 11.5–15.5)
WBC: 8.5 10*3/uL (ref 4.0–10.5)
nRBC: 0 % (ref 0.0–0.2)

## 2023-10-12 LAB — SURGICAL PATHOLOGY

## 2023-10-12 MED ORDER — TRAMADOL HCL 50 MG PO TABS: 50.0000 mg | ORAL_TABLET | Freq: Four times a day (QID) | ORAL | 0 refills | Status: AC | PRN

## 2023-10-12 MED ORDER — TRAMADOL HCL 50 MG PO TABS
ORAL_TABLET | ORAL | Status: AC
  Filled 2023-10-12: qty 1

## 2023-10-12 NOTE — Discharge Summary (Signed)
Physician Discharge Summary  Patient ID: Ebony Hatfield MRN: [id number] DOB/AGE: 23-Oct-1950 73 y.o.  Admit date: 10/11/2023 Discharge date: 10/12/2023  Admission Diagnoses:symptomatic Uterine prolapse, cystocoele and rectocoele  Discharge Diagnoses: same Principal Problem:   Uterine prolapse   Discharged Condition: good  Hospital Course: [name] underwent an uncomplicated LAVH/BSO, A&P repair.  EBL 50cc.  Her post op care was unremarkable with quick return of bowel and bladder function.  Her PVR was 0cc.  Tolerated a regular diet and pain controlled with prn tramadol.  She desired d/c home  Consults:   Significant Diagnostic Studies: labs: post op hgb 10.7  Treatments: surgery: LAVH/BSO, A&P repair  Discharge Exam: Blood pressure 112/61, pulse 78, temperature 98.1 F (36.7 C), resp. rate 16, height 5' 2.5" (1.588 m), weight 59.5 kg, SpO2 94%. General appearance: alert, cooperative, appears stated age, and no distress GI: soft, non-tender; bowel sounds normal; no masses,  no organomegaly Incision/Wound:CD&I  Disposition: Discharge disposition: 01-Home or Self Care           Signed: Turner Daniels 10/12/2023, 1:07 PM

## 2023-10-12 NOTE — Op Note (Unsigned)
Ebony Hatfield, Ebony Hatfield MEDICAL RECORD NO: [id number] ACCOUNT NO: [id number] DATE OF BIRTH: 06/30/1951 FACILITY: [facility] LOCATION: [facility] PHYSICIAN: Dineen Kid. Rana Snare, MD  Operative Report   DATE OF PROCEDURE: 10/11/2023  PREOPERATIVE DIAGNOSES: 1.  Uterine prolapse. 2.  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  POSTOPERATIVE DIAGNOSES: 1.  Uterine prolapse. 2.  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 3.  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  PROCEDURES PERFORMED: Laparoscopic-assisted vaginal hysterectomy, bilateral salpingo-oophorectomy, lysis of adhesions from bowel to right ovary and pelvic sidewall to left ovary, anterior and posterior colporrhaphy, and perineorrhaphy.  SURGEON: Dr. Candice Camp.  ASSISTANT: Dr. Richarda Overlie.  ANESTHESIA: General endotracheal.  INDICATIONS: Ms. Kirshner is a 73 year old female who continues to have prolapse symptoms which prevents her from being active.  She does not have any stress urinary incontinence or digitalization symptoms with bowel movements. However, due to the cystocele, rectocele,  and uterine prolapse, she desires definitive surgical intervention.  We discussed the risks and benefits of the procedure at length, which include but are not limited to the risk of infection, bleeding, damage to the bowel, bladder, or ureters.  Also,  the recovery, which can include prolonged catheter wear, leaking of urine, having to learn to self-cath. Discussed light activity and bladder training.  The risks also include risk of anesthesia, blood transfusion, possibly have to perform laparotomy or  any other indicated procedures at that time. She does give her informed consent.  FINDINGS AT THE TIME OF SURGERY:  Normal-appearing liver.  Unable to visualize appendix.  The uterus does have several small fibroids of the fundus.  The right tube and ovary are adhered to the pelvic  sidewall and also epiploica from the colon.  The left  ovary is adhered to the pelvic sidewall and fallopian tube.  The uterus otherwise appears normal.  The cul-de-sac appears normal.  DESCRIPTION OF PROCEDURE: After adequate analgesia, the patient was placed in the dorsal lithotomy position.  She was sterilely prepped and draped.  The bladder was sterilely drained. Gradient speculum was placed.  An Acorn tenaculum was placed on the cervix and the tenaculum was  placed on the anterior lip of the cervix.  The legs were repositioned.  A 1 cm infraumbilical skin incision was made. A Veress needle was inserted. The abdomen was insufflated under dullness and percussion followed by an 11-mm trocar inserted and the  laparoscope was inserted.  The above findings were noted.  A 5-mm trocar was inserted to the left of the midline, two fingerbreadths above the pubic symphysis under direct visualization.  After careful and systematic evaluation of the abdomen and pelvis,  gyrus cutting forceps were used to gently dissect and negate the adhesions from the colon and epiploica to the right tubo-ovarian complex.  Care was taken to avoid injury to the bowel and pelvic sidewall. Once the right tube and ovary were freed up, the  right infundibulopelvic ligament was ligated with gyrus cutting forceps and dissected across the broad ligament down to the inferior portion of the broad ligament below the round ligament. The left tube and ovary were grasped and dissected from the pelvic  sidewall using the gyrus cutting forceps. Care was taken to avoid the underlying ureter and achieve good hemostasis.  After the left tube and ovarian were free from the pelvic sidewall, the infundibulopelvic ligament was then ligated and dissected using  the gyrus cutting forceps down across the round ligament to the inferior  portion of the broad ligament along the left side. The bladder was then elevated and endoshoots were used to dissect the  anterior surface of the bladder off the anterior lower segment  of the uterus. The abdomen was then desufflated. The leg was repositioned and began with the vaginal portion.  A weighted speculum was placed in the vagina.  Jacob's tenaculum was placed on the posterior cervix.  Posterior colpotomy was performed with  Mayo scissors.  The cervix was then circumscribed with Bovie cautery. LigaSure Impact was used to ligate across the uterosacral ligaments bilaterally, cardinal ligaments, and bladder pillars bilaterally.  The bladder was dissected off the anterior  surface of the cervix and a DeLee retractor was placed underneath the bladder after entering the anterior peritoneum.  LigaSure instrument was then used to litigate across the inferior portions of the broad ligaments bilaterally and the uterus, tubes,  and ovary were easily removed. Wet packing was then placed.  The uterosacral ligaments were identified bilaterally and suture ligated with #0 Monocryl suture in a figure-of-eight fashion.  The posterior peritoneum was then closed in a pursestring  fashion. The uterosacral ligaments were then plicated in the midline and the vagina was then closed in a vertical fashion using figure-of-eight #0 Monocryl suture with good approximation and good hemostasis and good support noted.  The packing was then  removed and the anterior vagina was closed in a vertical fashion using figure-of-eight #0 Monocryl suture. The anterior vaginal mucosa was then grasped at the cuff, pulled down into the introitus and an Allis clamp was placed in the midline of the  cystocele.  Metzenbaum scissors were used to undermine and then dissect in the midline and then reflect the anterior vaginal mucosa off the cystocele.  This was performed until approximately 2 cm away from the urethral meatus.  The anterior vaginal mucus  was reflected laterally off the cystocele. The cystocele was then reduced with figure-of-eights of 0 Monocryl  suture pulling the pubic fascicular cervical fascia from the side of the cystocele and plicated in the midline with good support of the  cystocele and good reduction of the cystocele noted.  After good support and good hemostasis was achieved, the excess vaginal mucosa was then excised and the anterior vaginal mucosa was closed with a 2-0 Monocryl suture in a running locking fashion with  good approximation and good hemostasis and good support noted. The introitus was then grasped with two Allis clamps at the edge of the perineal body.  A scalpel was used to make a little triangular flap across the perineal body that was excised and then  an Allis clamp was used in the midline of the posterior rectocele to elevate that. The Metzenbaum scissors were used to undermine the posterior vaginal mucosa and reflect it laterally.  This was performed until the rectocele had been completely  identified approximately two-thirds up to the vaginal cuff.  The posterior vaginal mucosa was dissected off the rectocele.  At this time, it was noted that the vaginal cuff was at least 2 to 3 cm away from the sacrospinous ligaments, so there was no  possible way to perform the sacrospinous ligament suspension due to the shortened vaginal length.  The rectocele was then reflected from the vaginal mucosa.  The posterior rectal fascia was then plicated in the midline using figure-of-eights of 0  Monocryl suture with good posterior vaginal support and complete reduction of the posterior rectocele. Excess vaginal mucosa was then removed and 2-0 Monocryl was  used in a running locking fashion to close the posterior vaginal mucosa.  0 Monocryl suture  was used to pull the bulbocavernosus and superficial transverse perineal muscles together completing the perineoplasty with 2-0 Monocryl suture used to close the skin with subcuticular fashion and good approximation and good hemostasis noted.  Foley  catheter was placed.  Return of clear  yellow urine.  A vaginal packing was placed which was coated with estrogen cream. The legs were repositioned.  The abdomen was re-insufflated.  The laparoscope was then inserted and after copious amount of  irrigation, adequate hemostasis was assured. Examination of the pedicles revealed good hemostasis of the infundibulopelvic ligaments bilaterally.  The bowel appeared to be normal without any obvious injury noted.  The ureters were identified bilaterally  with good peristalsis noted and no obvious injury noted.  The peritoneal edge near the bladder was then coated with Arista and good hemostasis was achieved.  The abdomen was then desufflated. Trocars were removed. The infraumbilical skin incision was  closed with 0 Vicryl interrupted suture in the fascia, 3-0 Vicryl Rapide subcuticular suture.  The 5-mm site was closed with 3-0 Vicryl Rapide subcuticular sutures.  The incision was injected with 0.25% Marcaine, 10 mL used.  The patient tolerated the  procedure well. She was then transferred to the recovery room.  Sponge, needle and instrument count was normal x 3.  Estimated blood loss was 50 mL.  She did receive 2 grams of cefotetan preoperatively and 1 gram of tranexamic acid preoperatively.   MUK D: 10/11/2023 6:48:09 pm T: 10/12/2023 12:39:00 am  JOB: [id number]/ [id number]

## 2023-10-12 NOTE — Discharge Instructions (Signed)

## 2024-01-18 ENCOUNTER — Other Ambulatory Visit: Payer: Self-pay

## 2024-01-18 ENCOUNTER — Ambulatory Visit: Attending: Obstetrics and Gynecology

## 2024-01-18 DIAGNOSIS — R279 Unspecified lack of coordination: Secondary | ICD-10-CM | POA: Diagnosis present

## 2024-01-18 DIAGNOSIS — M6281 Muscle weakness (generalized): Secondary | ICD-10-CM | POA: Insufficient documentation

## 2024-01-18 DIAGNOSIS — R293 Abnormal posture: Secondary | ICD-10-CM | POA: Insufficient documentation

## 2024-01-18 NOTE — Therapy (Addendum)
 OUTPATIENT PHYSICAL THERAPY FEMALE PELVIC EVALUATION   Patient Name: Ebony Hatfield MRN: [id number] DOB:07-18-1951, 73 y.o., female Today's Date: 01/18/2024  END OF SESSION:  PT End of Session - 01/18/24 0928     Visit Number 1    Date for PT Re-Evaluation 04/11/24    Authorization Type [employer] Medicare    Authorization Time Period waiting on auth    Progress Note Due on Visit 10    PT Start Time 0927    PT Stop Time 1010    PT Time Calculation (min) 43 min    Activity Tolerance Patient tolerated treatment well    Behavior During Therapy [facility] for tasks assessed/performed             Past Medical History:  Diagnosis Date   Asthma    followed by pcp   (09-30-2023  pt stated last used inhaler one month ago)   Cystocele with rectocele    Hyperlipidemia, mixed    evaluated by cardiology--- dr Floria Hurst (lov in epic 09-21-2018)  due to intolerant to statins;   normal myoview in epic 08-19-2012/  essentially normal echo 08-16-2012   Hypertension    Hypothyroidism    followed by pcp   Incomplete uterovaginal prolapse    Prolapse of female pelvic organs    SUI (stress urinary incontinence, female)    Wears glasses    Past Surgical History:  Procedure Laterality Date   ANTERIOR AND POSTERIOR REPAIR WITH SACROSPINOUS FIXATION N/A 10/11/2023   Procedure: ANTERIOR AND POSTERIOR REPAIR;  Surgeon: Belle Box, MD;  Location: [facility];  Service: Gynecology;  Laterality: N/A;   CATARACT EXTRACTION W/ INTRAOCULAR LENS IMPLANT Bilateral 2022   COLONOSCOPY  2022   LAPAROSCOPIC VAGINAL HYSTERECTOMY WITH SALPINGO OOPHORECTOMY Bilateral 10/11/2023   Procedure: LAPAROSCOPIC ASSISTED VAGINAL HYSTERECTOMY WITH SALPINGO OOPHORECTOMY;  Surgeon: Belle Box, MD;  Location: [facility];  Service: Gynecology;  Laterality: Bilateral;   TUBAL LIGATION Bilateral 1977   PPTL   WISDOM TOOTH EXTRACTION  2010   Patient Active Problem List   Diagnosis Date Noted   Uterine  prolapse 10/11/2023   Dyspnea on exertion 08/09/2012   Hyperlipidemia 08/09/2012    PCP: Ronna Coho, MD  REFERRING PROVIDER: Belle Box, MD   REFERRING DIAG: N39.3 (ICD-10-CM) - Stress incontinence (female) (female)  THERAPY DIAG:  Muscle weakness (generalized)  Unspecified lack of coordination  Abnormal posture  Rationale for Evaluation and Treatment: Rehabilitation  ONSET DATE: 10/11/23  SUBJECTIVE:  SUBJECTIVE STATEMENT: Pt states that she had almost constant urinary incontinence for several weeks after hysterectomy/prolapse repair. She states that she is not sure she should be here because she is now having more stress incontinence intermittently.  Fluid intake: black tea in the morning and coffee, 2 glasses of water  during the day   PAIN:  Are you having pain? No   PRECAUTIONS: None  RED FLAGS: None   WEIGHT BEARING RESTRICTIONS: No  FALLS:  Has patient fallen in last 6 months? No  OCCUPATION: retired   ACTIVITY LEVEL : swim   PLOF: Independent  PATIENT GOALS: decrease urinary leaking  PERTINENT HISTORY:  Anterior/posterior repair with sacrospinous fixation 10/11/23, laparoscopic vaginal hysterectomy with salpingo-oophorectomy 10/11/23, tubal ligation 1977, asthma, HTN,   BOWEL MOVEMENT: Pain with bowel movement: No Type of bowel movement:Frequency 1-3x/day and Strain no Fully empty rectum: Yes:   Leakage: No Pads: Yes: see below Fiber supplement/laxative No  URINATION: Pain with urination: No Fully empty bladder: Yes: she thinks she does Stream: Strong Urgency: No, but she may start to leak in the process of sitting down on the toilet Frequency: every hour Leakage: Urge to void, Walking to the bathroom, Coughing, Sneezing, and Laughing Pads: Yes: 2-3 during the  day, 1 at night  INTERCOURSE:  Not sexually active right now, pain 10 years ago   PREGNANCY: Vaginal deliveries 3 Tearing Yes: with first Episiotomy No C-section deliveries 0 Currently pregnant No  PROLAPSE: None   OBJECTIVE:  Note: Objective measures were completed at Evaluation unless otherwise noted.  01/18/24  PATIENT SURVEYS:   PFIQ-7: 33  COGNITION: Overall cognitive status: Within functional limits for tasks assessed     SENSATION: Light touch: Appears intact  FUNCTIONAL TESTS:  Squat: Lt weight shift, bil LE ER Single leg stance:  Rt: pelvic drop   Lt: pelvic  Curl-up test: no disotrtion   GAIT: Assistive device utilized: None Comments: WNL  POSTURE: rounded shoulders, forward head, decreased lumbar lordosis, increased thoracic kyphosis, posterior pelvic tilt, and Rt posterior rotation    LUMBARAROM/PROM:  A/PROM A/PROM  Eval (% available)  Flexion 50  Extension 25  Right lateral flexion 25  Left lateral flexion 25  Right rotation 25  Left rotation 25   (Blank rows = not tested)   PALPATION:   General: tightness in bil obliques   Pelvic Alignment: Rt posterior rotation   Abdominal: WNL, scar tissue from recent laparoscopic surgery                External Perineal Exam: dryness                             Internal Pelvic Floor: stenotic introitus and vaginal canal; no tenderness  Patient confirms identification and approves PT to assess internal pelvic floor and treatment Yes  PELVIC MMT:   MMT eval  Vaginal 3/5, 3 second endurance, 9 repeat contractions   Diastasis Recti 2 finger width separation  (Blank rows = not tested)        TONE: low  PROLAPSE: WNL  TODAY'S TREATMENT:  DATE:  01/18/24  EVAL  Neuromuscular re-education: Pt provides verbal consent for internal vaginal/rectal pelvic floor  exam. Internal vaginal pelvic floor muscle contraction training Quick flicks Long holds  Urge drill The knack   PATIENT EDUCATION:  Education details: See above Person educated: Patient Education method: Explanation, Demonstration, Tactile cues, Verbal cues, and Handouts Education comprehension: verbalized understanding  HOME EXERCISE PROGRAM: [id number]  ASSESSMENT:  CLINICAL IMPRESSION: Patient is a 73 y.o. female who was seen today for physical therapy evaluation and treatment for urinary incontinence. Exam findings notable for decreased lumbar A/ROM, abnormal posture, decreased pelvic stability during single leg stance, abnormla squat form, pelvic floor muscle weakness, and decreased pelvic floor muscle endurance. Signs and symptoms are most consistent with pelvic floor muscle weakness, decreased coordination of pelvic floor muscles and deep core, and abnormal pressure management. Initial treatment consisted of pelvic floor muscle contraction training, urge drill, the knack, and education of importance of appropriate pressure management. She will continue to benefit from skilled PT intervention in order to decrease urinary incontinence, improve bladder habits/voiding schedule, and progress functional strengthening program.   OBJECTIVE IMPAIRMENTS: decreased activity tolerance, decreased coordination, decreased endurance, decreased mobility, decreased ROM, decreased strength, increased fascial restrictions, increased muscle spasms, impaired tone, improper body mechanics, postural dysfunction, and pain.   ACTIVITY LIMITATIONS: lifting, squatting, and continence  PARTICIPATION LIMITATIONS: community activity and exercise  PERSONAL FACTORS: 1 comorbidity: medical history are also affecting patient's functional outcome.   REHAB POTENTIAL: Good  CLINICAL DECISION MAKING: Stable/uncomplicated  EVALUATION COMPLEXITY: Low   GOALS: Goals reviewed with patient? Yes  SHORT TERM GOALS:  Target date: 02/15/2024    Pt will be independent with advanced HEP.   Baseline: Goal status: INITIAL  2.  Pt will report 25% improvement in urinary incontinence in order to protect skin integrity.  Baseline:  Goal status: INITIAL  3.  Pt will be independent with use of the knack and urge drill in order to allow her improved time with community activities without having to find bathroom or leaking in public.  Baseline:  Goal status: INITIAL   LONG TERM GOALS: Target date: 04/11/24  Pt will be independent with advanced HEP.   Baseline:  Goal status: INITIAL  2.  Pt will report 75% improvement in urinary incontinence in order to protect skin integrity.  Baseline:  Goal status: INITIAL  3.  Pt will increase all lumbar A/ROM by 25% in order to improve length tension relationship in pelvic floor muscles to decrease urinary leakage that gets in the way of household chores.  Baseline:  Goal status: INITIAL  4.  Pt will improve pelvic floor muscle strength to 4/5 in order to decrease use of pads to no greater than 1 a day. Baseline:  Goal status: INITIAL  5.  Pt will improve pelvic floor muscle endurance to >10 seconds in order to decrease use of pads to no greater than 1 a day.  Baseline:  Goal status: INITIAL  6.  Pt will demonstrate pelvic stability in single leg stance in order to decrease leaking that occurs during lifting.  Baseline:  Goal status: INITIAL  PLAN:  PT FREQUENCY: 1-2x/week  PT DURATION: 12 weeks  PLANNED INTERVENTIONS: 97110-Therapeutic exercises, 97530- Therapeutic activity, 97112- Neuromuscular re-education, 97535- Self Care, 54098- Manual therapy, Dry Needling, and Biofeedback  PLAN FOR NEXT SESSION: begin core training; progress pelvic floor muscle strengthening to standing positions if proprioception is improved; start official voiding schedule based on how urge drill went  PHYSICAL THERAPY DISCHARGE  SUMMARY  Visits from Start of Care:  1  Current functional level related to goals / functional outcomes: Not met   Remaining deficits: See above   Education / Equipment: HEP   Patient agrees to discharge. Patient goals were not met. Patient is being discharged due to insurance not authorizing visits.  Verlena Glenn, PT, DPT05/02/2509:22 AM

## 2024-01-18 NOTE — Patient Instructions (Signed)
Urge Incontinence  Ideal urination frequency is every 2-4 wakeful hours, which equates to 5-8 times within a 24-hour period.   Urge incontinence is leakage that occurs when the bladder muscle contracts, creating a sudden need to go before getting to the bathroom.   Going too often when your bladder isn't actually full can disrupt the body's automatic signals to store and hold urine longer, which will increase urgency/frequency.  In this case, the bladder "is running the show" and strategies can be learned to retrain this pattern.   One should be able to control the first urge to urinate, at around 150mL.  The bladder can hold up to a "grande latte," or 400mL. To help you gain control, practice the Urge Drill below when urgency strikes.  This drill will help retrain your bladder signals and allow you to store and hold urine longer.  The overall goal is to stretch out your time between voids to reach a more manageable voiding schedule.    Practice your "quick flicks" often throughout the day (each waking hour) even when you don't need feel the urge to go.  This will help strengthen your pelvic floor muscles, making them more effective in controlling leakage.  Urge Drill  When you feel an urge to go, follow these steps to regain control: Stop what you are doing and be still Take one deep breath, directing your air into your abdomen Think an affirming thought, such as "I've got this." Do 5 quick flicks of your pelvic floor Walk with control to the bathroom to void, or delay voiding        The knack: Use this technique while coughing, laughing, sneezing, or with any activities that causes you to leak urine a little. Right before you perform one of these activities that increase pressure in the abdomen and pushes a little urine out, perform a pelvic floor muscle contraction and hold. If that does not completely stop the leaking, try tightening your thighs together in addition to performing a  pelvic floor muscle contraction. Make sure you are not trying to stifle a cough, sneeze, or laugh; allow these activities in full as it will cause less pressure down into the bladder and pelvic floor muscles.       [facility] [address], [address] [place], [place] [address] Phone # [phone] Fax [phone]  

## 2024-03-16 ENCOUNTER — Ambulatory Visit

## 2024-03-23 ENCOUNTER — Encounter

## 2024-03-30 ENCOUNTER — Encounter

## 2024-04-06 ENCOUNTER — Encounter
